# Patient Record
Sex: Female | Born: 1965 | Race: White | Hispanic: No | Marital: Single | Smoking: Never smoker
Health system: Southern US, Community
[De-identification: ages and names within clinical notes are randomized; demographics above are authoritative.]

## PROBLEM LIST (undated history)

## (undated) DIAGNOSIS — F32A Depression, unspecified: Secondary | ICD-10-CM

## (undated) DIAGNOSIS — R569 Unspecified convulsions: Secondary | ICD-10-CM

## (undated) DIAGNOSIS — F259 Schizoaffective disorder, unspecified: Secondary | ICD-10-CM

## (undated) DIAGNOSIS — H3320 Serous retinal detachment, unspecified eye: Secondary | ICD-10-CM

## (undated) DIAGNOSIS — H9190 Unspecified hearing loss, unspecified ear: Secondary | ICD-10-CM

## (undated) DIAGNOSIS — F419 Anxiety disorder, unspecified: Secondary | ICD-10-CM

## (undated) DIAGNOSIS — H269 Unspecified cataract: Secondary | ICD-10-CM

## (undated) DIAGNOSIS — M2450 Contracture, unspecified joint: Secondary | ICD-10-CM

## (undated) DIAGNOSIS — F329 Major depressive disorder, single episode, unspecified: Secondary | ICD-10-CM

## (undated) DIAGNOSIS — F250 Schizoaffective disorder, bipolar type: Secondary | ICD-10-CM

## (undated) DIAGNOSIS — N319 Neuromuscular dysfunction of bladder, unspecified: Secondary | ICD-10-CM

## (undated) DIAGNOSIS — IMO0001 Reserved for inherently not codable concepts without codable children: Secondary | ICD-10-CM

## (undated) DIAGNOSIS — G809 Cerebral palsy, unspecified: Secondary | ICD-10-CM

## (undated) DIAGNOSIS — I10 Essential (primary) hypertension: Secondary | ICD-10-CM

## (undated) DIAGNOSIS — K219 Gastro-esophageal reflux disease without esophagitis: Secondary | ICD-10-CM

## (undated) HISTORY — PX: ABDOMINAL HYSTERECTOMY: SHX81

## (undated) HISTORY — PX: LEG SURGERY: SHX1003

## (undated) HISTORY — PX: HAND SURGERY: SHX662

---

## 1997-12-10 ENCOUNTER — Inpatient Hospital Stay (HOSPITAL_COMMUNITY): Admission: EM | Admit: 1997-12-10 | Discharge: 1998-01-09 | Payer: Self-pay | Admitting: Emergency Medicine

## 1998-01-29 ENCOUNTER — Inpatient Hospital Stay (HOSPITAL_COMMUNITY): Admission: RE | Admit: 1998-01-29 | Discharge: 1998-03-03 | Payer: Self-pay | Admitting: *Deleted

## 1998-03-11 ENCOUNTER — Ambulatory Visit (HOSPITAL_COMMUNITY): Admission: RE | Admit: 1998-03-11 | Discharge: 1998-03-11 | Payer: Self-pay | Admitting: Urology

## 1998-03-11 ENCOUNTER — Encounter: Payer: Self-pay | Admitting: Urology

## 1998-03-13 ENCOUNTER — Encounter: Payer: Self-pay | Admitting: Urology

## 1998-03-13 ENCOUNTER — Ambulatory Visit (HOSPITAL_COMMUNITY): Admission: RE | Admit: 1998-03-13 | Discharge: 1998-03-13 | Payer: Self-pay | Admitting: Urology

## 1998-04-03 ENCOUNTER — Ambulatory Visit (HOSPITAL_COMMUNITY): Admission: RE | Admit: 1998-04-03 | Discharge: 1998-04-03 | Payer: Self-pay | Admitting: Urology

## 1998-04-03 ENCOUNTER — Encounter: Payer: Self-pay | Admitting: Urology

## 1998-06-21 ENCOUNTER — Encounter: Payer: Self-pay | Admitting: Emergency Medicine

## 1998-06-21 ENCOUNTER — Inpatient Hospital Stay (HOSPITAL_COMMUNITY): Admission: EM | Admit: 1998-06-21 | Discharge: 1998-06-23 | Payer: Self-pay | Admitting: Emergency Medicine

## 1998-06-24 ENCOUNTER — Inpatient Hospital Stay (HOSPITAL_COMMUNITY): Admission: EM | Admit: 1998-06-24 | Discharge: 1998-07-01 | Payer: Self-pay | Admitting: *Deleted

## 1998-07-30 ENCOUNTER — Inpatient Hospital Stay (HOSPITAL_COMMUNITY): Admission: AD | Admit: 1998-07-30 | Discharge: 1998-08-26 | Payer: Self-pay | Admitting: *Deleted

## 1998-08-21 ENCOUNTER — Encounter: Payer: Self-pay | Admitting: *Deleted

## 1998-08-21 ENCOUNTER — Encounter: Payer: Self-pay | Admitting: Urology

## 1998-08-26 ENCOUNTER — Encounter: Payer: Self-pay | Admitting: Cardiology

## 1998-08-26 ENCOUNTER — Inpatient Hospital Stay (HOSPITAL_COMMUNITY): Admission: AD | Admit: 1998-08-26 | Discharge: 1998-09-01 | Payer: Self-pay | Admitting: Cardiology

## 1998-08-27 ENCOUNTER — Encounter: Payer: Self-pay | Admitting: Cardiology

## 1998-08-29 ENCOUNTER — Encounter: Payer: Self-pay | Admitting: General Surgery

## 1998-09-22 ENCOUNTER — Inpatient Hospital Stay (HOSPITAL_COMMUNITY): Admission: AD | Admit: 1998-09-22 | Discharge: 1998-10-02 | Payer: Self-pay | Admitting: *Deleted

## 1998-10-17 ENCOUNTER — Inpatient Hospital Stay (HOSPITAL_COMMUNITY): Admission: EM | Admit: 1998-10-17 | Discharge: 1998-10-22 | Payer: Self-pay | Admitting: *Deleted

## 1998-11-10 ENCOUNTER — Encounter (HOSPITAL_COMMUNITY): Admission: RE | Admit: 1998-11-10 | Discharge: 1998-12-16 | Payer: Self-pay | Admitting: *Deleted

## 1998-12-23 ENCOUNTER — Encounter (HOSPITAL_COMMUNITY): Admission: RE | Admit: 1998-12-23 | Discharge: 1999-02-09 | Payer: Self-pay | Admitting: *Deleted

## 1999-03-31 ENCOUNTER — Inpatient Hospital Stay (HOSPITAL_COMMUNITY): Admission: AD | Admit: 1999-03-31 | Discharge: 1999-04-15 | Payer: Self-pay | Admitting: *Deleted

## 1999-05-14 ENCOUNTER — Other Ambulatory Visit: Admission: RE | Admit: 1999-05-14 | Discharge: 1999-05-21 | Payer: Self-pay

## 1999-08-18 ENCOUNTER — Inpatient Hospital Stay (HOSPITAL_COMMUNITY): Admission: EM | Admit: 1999-08-18 | Discharge: 1999-09-04 | Payer: Self-pay | Admitting: *Deleted

## 1999-09-07 ENCOUNTER — Other Ambulatory Visit (HOSPITAL_COMMUNITY): Admission: RE | Admit: 1999-09-07 | Discharge: 1999-09-16 | Payer: Self-pay

## 2001-12-22 ENCOUNTER — Encounter: Payer: Self-pay | Admitting: Internal Medicine

## 2001-12-22 ENCOUNTER — Ambulatory Visit (HOSPITAL_COMMUNITY): Admission: RE | Admit: 2001-12-22 | Discharge: 2001-12-22 | Payer: Self-pay | Admitting: Internal Medicine

## 2002-12-31 ENCOUNTER — Encounter: Payer: Self-pay | Admitting: Internal Medicine

## 2002-12-31 ENCOUNTER — Ambulatory Visit (HOSPITAL_COMMUNITY): Admission: RE | Admit: 2002-12-31 | Discharge: 2002-12-31 | Payer: Self-pay | Admitting: Internal Medicine

## 2003-01-11 ENCOUNTER — Ambulatory Visit (HOSPITAL_COMMUNITY): Admission: RE | Admit: 2003-01-11 | Discharge: 2003-01-11 | Payer: Self-pay | Admitting: Internal Medicine

## 2003-01-11 ENCOUNTER — Encounter: Payer: Self-pay | Admitting: Internal Medicine

## 2003-04-08 ENCOUNTER — Encounter: Admission: RE | Admit: 2003-04-08 | Discharge: 2003-04-08 | Payer: Self-pay | Admitting: Internal Medicine

## 2003-06-19 ENCOUNTER — Inpatient Hospital Stay (HOSPITAL_COMMUNITY): Admission: EM | Admit: 2003-06-19 | Discharge: 2003-07-01 | Payer: Self-pay | Admitting: Psychiatry

## 2003-07-16 ENCOUNTER — Encounter: Admission: RE | Admit: 2003-07-16 | Discharge: 2003-07-16 | Payer: Self-pay | Admitting: *Deleted

## 2003-09-03 ENCOUNTER — Inpatient Hospital Stay (HOSPITAL_COMMUNITY): Admission: EM | Admit: 2003-09-03 | Discharge: 2003-09-26 | Payer: Self-pay | Admitting: Psychiatry

## 2003-09-18 ENCOUNTER — Encounter: Payer: Self-pay | Admitting: *Deleted

## 2004-01-24 ENCOUNTER — Ambulatory Visit (HOSPITAL_COMMUNITY): Admission: RE | Admit: 2004-01-24 | Discharge: 2004-01-24 | Payer: Self-pay | Admitting: Gastroenterology

## 2005-10-04 ENCOUNTER — Ambulatory Visit (HOSPITAL_COMMUNITY): Admission: RE | Admit: 2005-10-04 | Discharge: 2005-10-04 | Payer: Self-pay | Admitting: Urology

## 2005-11-10 ENCOUNTER — Encounter: Payer: Self-pay | Admitting: Urology

## 2006-02-20 ENCOUNTER — Emergency Department (HOSPITAL_COMMUNITY): Admission: EM | Admit: 2006-02-20 | Discharge: 2006-02-20 | Payer: Self-pay | Admitting: Emergency Medicine

## 2006-05-31 ENCOUNTER — Emergency Department (HOSPITAL_COMMUNITY): Admission: EM | Admit: 2006-05-31 | Discharge: 2006-06-01 | Payer: Self-pay | Admitting: Emergency Medicine

## 2006-12-13 ENCOUNTER — Ambulatory Visit (HOSPITAL_COMMUNITY): Admission: RE | Admit: 2006-12-13 | Discharge: 2006-12-13 | Payer: Self-pay | Admitting: Gastroenterology

## 2006-12-20 ENCOUNTER — Inpatient Hospital Stay (HOSPITAL_COMMUNITY): Admission: RE | Admit: 2006-12-20 | Discharge: 2006-12-22 | Payer: Self-pay | Admitting: Urology

## 2007-01-05 ENCOUNTER — Ambulatory Visit (HOSPITAL_COMMUNITY): Admission: RE | Admit: 2007-01-05 | Discharge: 2007-01-05 | Payer: Self-pay | Admitting: Internal Medicine

## 2007-06-06 ENCOUNTER — Inpatient Hospital Stay (HOSPITAL_COMMUNITY): Admission: EM | Admit: 2007-06-06 | Discharge: 2007-06-16 | Payer: Self-pay | Admitting: Emergency Medicine

## 2010-05-14 ENCOUNTER — Emergency Department (HOSPITAL_COMMUNITY): Admission: EM | Admit: 2010-05-14 | Discharge: 2010-05-14 | Payer: Self-pay | Admitting: Emergency Medicine

## 2010-05-18 ENCOUNTER — Encounter: Admission: RE | Admit: 2010-05-18 | Discharge: 2010-05-18 | Payer: Self-pay | Admitting: Family Medicine

## 2010-07-08 ENCOUNTER — Emergency Department (HOSPITAL_COMMUNITY)
Admission: EM | Admit: 2010-07-08 | Discharge: 2010-07-08 | Payer: Self-pay | Source: Home / Self Care | Admitting: Emergency Medicine

## 2010-08-17 ENCOUNTER — Ambulatory Visit (HOSPITAL_COMMUNITY)
Admission: RE | Admit: 2010-08-17 | Discharge: 2010-08-17 | Disposition: A | Discharge status: Home / Self Care | Payer: Medicare Other | Source: Ambulatory Visit | Attending: Internal Medicine | Admitting: Internal Medicine

## 2010-08-17 ENCOUNTER — Ambulatory Visit (HOSPITAL_COMMUNITY): Admission: RE | Admit: 2010-08-17 | Payer: Medicare Other | Source: Ambulatory Visit

## 2010-08-17 DIAGNOSIS — Z79899 Other long term (current) drug therapy: Secondary | ICD-10-CM | POA: Insufficient documentation

## 2010-08-17 DIAGNOSIS — Z0389 Encounter for observation for other suspected diseases and conditions ruled out: Secondary | ICD-10-CM | POA: Insufficient documentation

## 2010-08-17 DIAGNOSIS — R9431 Abnormal electrocardiogram [ECG] [EKG]: Secondary | ICD-10-CM | POA: Insufficient documentation

## 2010-09-22 LAB — CBC
Platelets: 216 10*3/uL (ref 150–400)
RBC: 4.43 MIL/uL (ref 3.87–5.11)
RDW: 13.8 % (ref 11.5–15.5)
WBC: 8.1 10*3/uL (ref 4.0–10.5)

## 2010-09-22 LAB — URINALYSIS, ROUTINE W REFLEX MICROSCOPIC
Bilirubin Urine: NEGATIVE
Ketones, ur: NEGATIVE mg/dL
Specific Gravity, Urine: 1.03 (ref 1.005–1.030)
Urobilinogen, UA: 1 mg/dL (ref 0.0–1.0)
pH: 5 (ref 5.0–8.0)

## 2010-09-22 LAB — URINE CULTURE
Colony Count: >=100000
Culture  Setup Time: 201111040037

## 2010-09-22 LAB — URINE MICROSCOPIC-ADD ON

## 2010-09-22 LAB — COMPREHENSIVE METABOLIC PANEL
ALT: 13 U/L (ref 0–35)
AST: 14 U/L (ref 0–37)
Albumin: 3.4 g/dL — ABNORMAL LOW (ref 3.5–5.2)
Alkaline Phosphatase: 44 U/L (ref 39–117)
GFR calc Af Amer: >60 mL/min (ref >60)
Potassium: 4.3 mEq/L (ref 3.5–5.1)
Sodium: 139 mEq/L (ref 135–145)
Total Protein: 6.5 g/dL (ref 6.0–8.3)

## 2010-09-22 LAB — DIFFERENTIAL
Basophils Relative: 0 % (ref 0–1)
Eosinophils Absolute: 0.1 10*3/uL (ref 0.0–0.7)
Lymphs Abs: 2.5 10*3/uL (ref 0.7–4.0)
Monocytes Absolute: 0.6 10*3/uL (ref 0.1–1.0)
Monocytes Relative: 8 % (ref 3–12)
Neutro Abs: 4.8 10*3/uL (ref 1.7–7.7)

## 2010-10-01 ENCOUNTER — Emergency Department (HOSPITAL_COMMUNITY)
Admission: EM | Admit: 2010-10-01 | Discharge: 2010-10-01 | Disposition: A | Discharge status: Home / Self Care | Payer: Medicare Other | Attending: Emergency Medicine | Admitting: Emergency Medicine

## 2010-10-01 DIAGNOSIS — G809 Cerebral palsy, unspecified: Secondary | ICD-10-CM | POA: Insufficient documentation

## 2010-10-01 DIAGNOSIS — Y92410 Unspecified street and highway as the place of occurrence of the external cause: Secondary | ICD-10-CM | POA: Insufficient documentation

## 2010-10-01 DIAGNOSIS — F79 Unspecified intellectual disabilities: Secondary | ICD-10-CM | POA: Insufficient documentation

## 2010-10-01 DIAGNOSIS — T148XXA Other injury of unspecified body region, initial encounter: Secondary | ICD-10-CM | POA: Insufficient documentation

## 2010-10-01 DIAGNOSIS — M542 Cervicalgia: Secondary | ICD-10-CM | POA: Insufficient documentation

## 2010-11-24 NOTE — Op Note (Signed)
NAMEMADYLYN, INSCO               ACCOUNT NO.:  [id number]   MEDICAL RECORD NO.:  [id number]          PATIENT TYPE:  INP   LOCATION:  1316                         FACILITY:  [facility]   PHYSICIAN:  Petra Kuba, M.D.    DATE OF BIRTH:  08-25-1965   DATE OF PROCEDURE:  06/14/2007  DATE OF DISCHARGE:                               OPERATIVE REPORT   PROCEDURE:  Esophagogastroduodenoscopy, both regular and with the  pediatric colonoscope.   INDICATIONS:  Questionable gastric outlet obstruction.   Consent was signed after risks and benefits and options were thoroughly  discussed with the parents and the patient.   MEDICINES USED:  Fentanyl 100 micrograms, Versed 4 mg.   DESCRIPTION OF PROCEDURE:  First, the video endoscope was inserted via  direct vision.  The esophagus was normal, except for a tiny to small  hiatal hernia.  Scope passed into the stomach.  Very minimal fluid was  left, which was suctioned.  The scope was advanced through a normal  antrum, normal pylorus, into a normal duodenal bulb.  The second portion  of the duodenum did have a moderate amount of spasm.  No obvious  obstruction was seen.  However, due to looping in the stomach, could not  advance any further.  We went ahead and slowly withdrew back to the  bulb, which was normal, and back to the stomach, which was evaluated on  retroflexion.  Visualization without any additional findings.  Air was  suctioned.  Scope was slowly withdrawn again.  A good look at the  esophagus was normal.  The scope was removed.   We then went ahead and inserted the pediatric colonoscope, which was  easily able to advance through the duodenum and probably 10 cm down the  jejunum.  There were no signs of blockage, inflammation or other  abnormalities.  Again, some duodenal spasm was seen, but on slow  withdrawal, no additional findings were seen.  We had minimal difficulty  advancing this scope to that distance.  Once back in the  stomach, air  was suctioned, scope removed.  The patient tolerated the procedure well.  There was no obvious immediate complication.   ENDOSCOPIC DIAGNOSES:  1. Tiny hiatal hernia.  2. Widely patent pylorus.  3. Some duodenal spasm.  4. Unable to advance the scope past the second portion of duodenum.  5. Able to advance the pediatric colonoscope into the jejunum, which      was normal, without any obvious blockage, obstruction or      abnormality, except for spasm noted above.   PLAN:  We will re-try clear liquids, see how she does, possibly try an  antispasmodic.  Can hold NG tube for now.  We will follow with you.           ______________________________  Petra Kuba, M.D.     MEM/MEDQ  D:  06/14/2007  T:  06/14/2007  Job:  [id number]   cc:   Candyce Churn, M.D.  Fax: [phone]

## 2010-11-24 NOTE — Discharge Summary (Signed)
Kelly Dominguez, Kelly Dominguez               ACCOUNT NO.:  [id number]   MEDICAL RECORD NO.:  [id number]          PATIENT TYPE:  INP   LOCATION:  1316                         FACILITY:  [facility]   PHYSICIAN:  Candyce Churn, M.D.DATE OF BIRTH:  May 02, 1966   DATE OF ADMISSION:  06/06/2007  DATE OF DISCHARGE:  06/16/2007                               DISCHARGE SUMMARY   DISCHARGE DIAGNOSES:  1. Gastric and duodenal distention with functional outlet obstruction      likely secondary to bowel wall edema likely secondary to Trilafon.      Clinical syndrome masqueraded as SMA syndrome.  2. History of cerebral palsy.  3. History of psychosis with marked reduction in antipsychotic      medications during this admission with good clinical response.  4. History of scoliosis.  5. History of recurrent constipation.  6. History of recurrent urinary tract infection.  7. Gastroesophageal reflux disease.  8. Allergic rhinitis.  9. Seborrheic dermatitis.   DISCHARGE MEDICATIONS:  1. Effexor XR 150 mg p.o. b.i.d.  2. Lioresal 10 mg p.o. t.i.d.  3. Lortab 5/325 one p.o. b.i.d.  4. Cogentin 0.5 mg p.o. b.i.d. This perhaps can be discontinued.  5. Mylicon or equivalent substitute 80 mg p.o. b.i.d.  6. Dulcolax suppository 10 mg q.h.s.  7. Uroxatral 10 mg p.o. q.h.s.  8. Reglan 5 mg p.o. t.i.d. before meals.  9. Lotrimin cream 1% applied to facial rash b.i.d. p.r.n.  10.Protonix 40 mg daily.  11.Senokot S 2 tablets p.o. q.h.s.  12.Resource peach liquid 240 mL or equivalent t.i.d.  13.Tylenol 650 mg p.o. q.4 h p.r.n.   ALLERGIES:  TRILAFON appears to have caused significant functional  bowel, gastric outlet and duodenal obstruction likely secondary to  angioedema. Also does not tolerate CYMBALTA and SEPTRA.   CONSULTATIONS:  1. Gastroenterology, Dr. Dorena Cookey, et al.  2. General surgery, Dr. Claud Kelp.   HOSPITAL COURSE:  Kelly Dominguez is a very pleasant, 45 year old,  unfortunate female  with cerebral palsy who is confined to a skilled  nursing facility. She has a history of psychosis and cerebral palsy and  has chronic problems with constipation and has also had obstructive  renal calculi. She has had marked weight loss over the past year as well  as abdominal pain and intermittent distention with episodes of nausea  and vomiting. She underwent a two-view abdominal x-ray at [facility] the day of admission noting the possibility of gastric outlet  obstruction. She was sent to the emergency room where she had a slight  white count and urinalysis was positive for UTI. She was also noted to  be markedly hypokalemic at 2.3 consistent with her recurrent vomiting.  Two-view abdominal x-ray in the [facility] emergency room on June 06, 2007 revealed marked gastric distention and could not exclude  gastric outlet obstruction. She had an NG tube replaced with some  relief.   Oral medications were discontinued.   The patient's gastric outlet obstruction seemed to resolve after several  days with NG suction and off of oral medications. It was felt that she  might have an SMA syndrome because her duodenum seemed to taper and  obstruct at the spine where the SMA and aorta cross over the third  portion of the duodenum.   CT of the abdomen with contrast on June 06, 2007 revealed marked  gastric distention with additional dilatation of the first and second  portion of the duodenum and the duodenum transitioned to a very narrow  caliber as it cross the midline with a question of a nutcracker  phenomenon or possible SMA syndrome.   Gastroenterology was consulted, Dr. Dorena Cookey was so kind to see the  patient on June 07, 2007 and a barium study was recommended which  revealed minimal patches of contrast into the distal duodenum but a  dilated distending duodenum with mild full thickening of the duodenum.  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   With further intravenous fluids and NG suctioning, symptoms resolved and  the patient was able to be fed and she felt much better with resolution  of leukocytosis by June 11, 2007. In fact, white count dropped from  19,000 to 7,000 on June 11, 2007. The patient felt great and  actually had 2 bowel movements.   However, that evening after resuming oral medications, her nausea and  vomiting returned.   It seemed that the only therapeutic changes were that oral medications  were resumed.   On review of medications that can cause GI symptoms, it was noted that  Trilafon is associated with angioneurotic edema as well as nausea and  vomiting.   The patient had to have NG tube reinserted and 3 liters of gastric  content and liquid were suctioned.   On the last 48 hours of admission, she had an EGD on June 14, 2007  which was within normal limits and this was after Trilafon had been  discontinued.   Resuming oral medications without Trilafon, the patient has done very  well.   The patient will be discharged back to [facility] nursing home eating  well on the above medications with vital signs as follows:  Temperature  98.8, pulse 79 and regular, respiratory rate 18 and nondistressed, blood  pressure 105/73, room air O2 saturation 96%.   I should mention that she did have a urinary tract infection while  hospitalized growing greater than 100,000 colonies of Providencia  stuartii. It was resistant to Cipro which she had originally been put on  though she seemed to clear the urinary tract regardless and she received  2 more doses of Rocephin prior to discharge and final urine culture on  June 11, 2007 revealed no growth.   DISCHARGE LABORATORIES:  CBC from June 16, 2007 revealed a white  count of 8200, hemoglobin 11.4,  platelet count 385,000. BMET revealed a  sodium of 138, potassium 4.8, chloride 106, bicarb 27, glucose 85, BUN  12, creatinine 0.45 and prealbumin was actually normal on June 15, 2007 at 19.7. Urinalysis on June 15, 2007 was slightly cloudy, pH  6.0, small blood, nitrite negative, leukocytes negative. Triglycerides  on June 15, 2007 were 102, phosphorus was 4.1, magnesium was 2.2.  LFTs on June 13, 2007 revealed normal LFTs except for a slightly low  albumin of 2.8. Alkaline phosphatase 66, AST 13, ALT 11.   CONDITION ON DISCHARGE:  Much improved. The patient will be discharged  back  to [facility] nursing home.      Candyce Churn, M.D.  Electronically Signed     RNG/MEDQ  D:  06/16/2007  T:  06/16/2007  Job:  [id number]   cc:   Angelia Mould. Derrell Lolling, M.D.  [address], [address]  [place]  [place] [address]   Ever[facility] All. Madilyn Fireman, M.D.  Fax: [phone]

## 2010-11-24 NOTE — H&P (Signed)
Kelly Dominguez, Kelly Dominguez               ACCOUNT NO.:  [id number]   MEDICAL RECORD NO.:  [id number]          PATIENT TYPE:  INP   LOCATION:  0104                         [facility]ACILITY:  [facility]   PHYSICIAN:  Hollice Espy, M.D.DATE OF BIRTH:  Jul 20, 1965   DATE OF ADMISSION:  06/06/2007  DATE OF DISCHARGE:                              HISTORY & PHYSICAL   PRIMARY CARE PHYSICIAN:  Candyce Churn, M.D.   CHIEF COMPLAINT:  Abdominal pain.   HISTORY OF PRESENT ILLNESS:  The patient is a 45 year old white female  with past medical history of psychosis and cerebral palsy who also  suffers from problems of chronic constipation as well as obstructing  kidney stones who has been noticing worsening abdominal pain over the  last 1-2 days.  She is cared for at a group home.  She continued to have  abdominal pain as well as distention and started having episodes of  nausea and vomiting.  She underwent a 2-view abdominal x-ray at  [facility] that noted possibility of gastric outlet  obstruction.  She was sent over to the emergency room, where on arrival,  she had labs drawn.  She was noted to have a white count of 11.1 with  shift and UA positive for UTI.  She also was noted to have a potassium  of 2.3 consistent with her recurrent vomiting.  A 2-view abdominal x-ray  showed marked gastric distention and cannot exclude a gastric outlet  obstruction, both bibasilar atelectasis.  Nonobstructing  left renal  calculi was noted as well.  The patient had an NG tube placed to suction  which provided some relief.   REVIEW OF SYSTEMS:  Currently she is doing okay.  She says her pain and  nausea are better but they are still present.  She denies any headaches  or vision changes.  No dysphagia, no chest pain or palpitations, no  shortness of breath, no wheezing or coughing.  She does admit to some  abdominal pain and distention.  No hematuria or dysuria.  She has had  problems with constipation.   No focal extremity numbness, weakness, or  pain.  Review of systems otherwise negative.   PAST MEDICAL HISTORY:  1. Psychosis.  2. Recurrent depressive disorder.  3. GERD.  4. Cerebral palsy.   MEDICATIONS:  1. Lexapro 20.  2. Prilosec 20.  3. Geodon 1 mg at breakfast.  4. Senokot daily.  5. Geodon 160 mg in the evening.  6. Cogentin 0.5 b.i.d.  7. Effexor 150 b.i.d.  8. Percocet 7.5/650 b.i.d.  9. Flonase 50 mcg one spray each nostril b.i.d.  10.Simethicone two tabs b.i.d.  11.Baclofen 10 t.i.d.  12.Resource.  13.Trilafon 4 mg at bedtime.  14.Dulcolax suppository at bedtime.  15.Uroxatral 10 at bedtime.  16.Senokot three tabs at bedtime.  17.Carafate four times a day.  18.Ativan one t.i.d.  19.Trilafon 2 mg in the morning.  20.P.r.n. Ativan, Lorcet, Claritin, and Baclofen.   ALLERGIES:  1. SEPTRA.  2. CYMBALTA.   SOCIAL HISTORY:  She resides in a group home.  No current tobacco,  alcohol, or drug  use.   FAMILY HISTORY:  Noncontributory.   PHYSICAL EXAMINATION:  VITAL SIGNS:  On admission, temperature 97.9,  heart rate 91, blood pressure 128/92, respirations 20, O2 saturation 97%  on 2 L.  GENERAL:  In general she is alert and oriented x2.  HEENT:  Normocephalic, atraumatic.  Mucous membranes are slightly dry.  She had an NG tube placed.  NECK:  She has no carotid bruits.  HEART:  Regular rate and rhythm.  S1/S2.  LUNGS:  Clear to auscultation bilaterally.  ABDOMEN:  Soft, distended.  Some tenderness, diffuse, with absent bowel  sounds.  EXTREMITIES:  Show no clubbing, cyanosis, or edema.   LABORATORY DATA:  White count 11.1, H&H 13.8 and 40, MCV 85, platelet  count 342, 81% shift.  Sodium 137, potassium 2.3, chloride 97, bicarb  26, BUN 12, creatinine 0.42, glucose 136.  LFTs are unremarkable.  Lipase normal.  UA notes 100 of protein, trace ketones, large leukocyte  esterase with 21-50 white cells, many bacteria.   ASSESSMENT/PLAN:  1. Gastric outlet  obstruction, questionably possible to constipation      playing a role.  Will n.p.o.  IV Protonix.  Check a CT of the      abdomen and pelvis.  Maybe start on a more aggressive bowel      regimen.  2. Psychosis.  Holding all p.o. medications unfortunately for now.      Maybe restart p.o.'s as soon as possible when she is able to take      p.o.  3. Urinary tract infection.  Again will check CT of the abdomen and      pelvis to rule out any type of obstructing stone.  Treat with IV      Cipro.  4. Cerebral palsy, stable.      Hollice Espy, M.D.  Electronically Signed     SKK/MEDQ  D:  06/06/2007  T:  06/07/2007  Job:  [id number]   cc:   Candyce Churn, M.D.  Fax: [phone]

## 2010-11-24 NOTE — Consult Note (Signed)
Kelly Dominguez, Kelly Dominguez               ACCOUNT NO.:  [id number]   MEDICAL RECORD NO.:  [id number]          PATIENT TYPE:  INP   LOCATION:  1316                         FACILITY:  [facility]   PHYSICIAN:  Angelia Mould. Derrell Lolling, M.D.DATE OF BIRTH:  [birth year]   DATE OF CONSULTATION:  06/09/2007  DATE OF DISCHARGE:                                 CONSULTATION   REFERRING PHYSICIAN:  Candyce Churn, M.D.   REASON FOR CONSULTATION:  Evaluate of gastric distention and possible  superior mesenteric artery syndrome.   HISTORY OF PRESENT ILLNESS:  This is a 45 year old white female who was  admitted to this hospital on June 06, 2007.  At that time she had  developed abdominal distention, abdominal pain, nausea and vomiting.   Her past history is significant for cerebral palsy, psychosis,  scoliosis, and a long history of recurrent constipation and urinary  tract infection.  In the past year she has had vague GI symptoms and  according to the family has lost about 25 pounds, although she is still  somewhat overweight.   Following her admission this time, she was found to have an abdominal  film showing acute gastric distention and a potassium of 2.3.  She has  had a CT scan which shows acute gastric distention and narrowing of the  third portion of the duodenum as it goes past the superior mesenteric  artery.  There is no inflammatory change.  There is no mass and there is  no adenopathy.  She has had an upper GI which shows partial obstruction  at the third portion of the duodenum, although this looks more like an  effacement of the duodenum than anything else.  When she was turned and  x-rays were taken later in the day, all of the contrast had gone through  and was in the colon.  This morning all of the contrast was in the colon  and the gastric distention has resolved.   She now states that she is hungry, has no pain, is passing flatus but no  stools today.  She has been seen by Dr. Dorena Cookey.   PAST MEDICAL HISTORY:  1. Psychosis.  2. Scoliosis.  3. Gastroesophageal reflux disease.  4. Cerebral palsy.  5. Status post abdominal hysterectomy.  6. Chronic urinary tract infections.  7. Chronic recurring constipation.   CURRENT MEDICATIONS:  1. Lexapro 20 mg a day.  2. Prilosec 20 mg a day.  3. Geodon 1 mg at breakfast.  4. Senokot daily.  5. Geodon 160 mg the evening.  6. Cogentin 0.5 mg b.i.d.  7. Effexor 150 mg b.i.d.  8. Percocet.  9. Flonase nasal spray.  10.Simethicone 2 tablets b.i.d.  11.Baclofen 10 mg t.i.d.  12.Resource.  13.Trilafon 4 mg at bedtime.  14.Dulcolax suppository.  15.Uroxatral 10 mg at bedtime.  16.Carafate.  17.Ativan.   ALLERGIES:  Septra and Cymbalta.   SOCIAL HISTORY:  She resides in a group home.  No current tobacco,  alcohol or drug use.  Her mother and father are here today in the  hospital visiting her.   FAMILY HISTORY:  Noncontributory.   REVIEW OF SYSTEMS:  Noncontributory.   PHYSICAL EXAM:  Pleasant, talkative, short-statured, somewhat overweight  white female in no distress.  She does have an obvious scoliosis.  She  is in the bed.  Temperature 98.5, heart rate 88 and regular, respirations 18, blood  pressure 124/63.  HEENT:  Eyes:  Sclerae clear.  Extraocular movements are intact.  Ears,  mouth and throat, nose, lips, tongue and oropharynx without gross  lesions.  NECK:  No mass.  No jugular distention.  LUNGS:  Clear to auscultation.  No chest wall tenderness.  HEART:  Regular rate and rhythm.  No murmur.  Radial and femoral pulses  are palpable.  ABDOMEN:  Soft, active bowel sounds, borderline distended but not  tympanitic.  Not tender.  Lower midline scar.  No masses.  No hernias.  Essentially a benign exam.  EXTREMITIES:  No clubbing, cyanosis or edema.  There is muscle wasting  and contractures.   DATA REVIEWED:  I reviewed the CT scan and the upper GI as described  above.  Hemoglobin 14.6, white blood  cell count 20,300, potassium today  is 3.1, glucose 108.   ASSESSMENT:  1. 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  Diagnosis is not      secure.  The superior mesenteric artery syndrome seems less likely      considering her normal body habitus, but perhaps scoliosis is      playing some role.  We need to rule out other causes of narrow      duodenum seen on CT and we need to rule out motility disorder.  2. Acute gastric distention, resolved, presumably secondary to #1      above.  3. Hypokalemia.  This needs to be corrected to a potassium of 4.0 to      promote GI motility.  4. Cerebral palsy.  5. Psychosis.  6. Scoliosis.  7. Chronic constipation.  8. Recurrent urinary tract infections.   PLAN:  1. There is no indication for surgical intervention at this point.  2. I agree with Dr. Madilyn Fireman that since she is obviously getting better,      attempt at feeding of liquids should be      considered.  3. If she has any recurring symptoms she will need an endoscopy and      examination of the entire duodenum to make sure there is not some      other etiology.      Angelia Mould. Derrell Lolling, M.D.  Electronically Signed     HMI/MEDQ  D:  06/09/2007  T:  06/09/2007  Job:  [id number]   cc:   Candyce Churn, M.D.  Fax: [phone]   Everardo All. Madilyn Fireman, M.D.  Fax: [phone]   Hollice Espy, M.D.

## 2010-11-24 NOTE — Op Note (Signed)
NAMECHASADY, LONGWELL               ACCOUNT NO.:  [id number]   MEDICAL RECORD NO.:  [id number]          PATIENT TYPE:  OIB   LOCATION:  1426                         FACILITY:  [facility]   PHYSICIAN:  Jamison Neighbor, M.D.  DATE OF BIRTH:  August 18, 1965   DATE OF PROCEDURE:  12/19/2006  DATE OF DISCHARGE:                               OPERATIVE REPORT   PREOPERATIVE DIAGNOSES:  1. Cerebral palsy, with neurogenic bladder.  2. Chronic cystitis.  3. Right ureteral calculus.   POSTOPERATIVE DIAGNOSES:  1. Cerebral palsy, with neurogenic bladder.  2. Chronic cystitis.  3. Right ureteral calculus.   PROCEDURE:  Cystoscopy, right retrograde, right ureteroscopy, and right  in situ laser lithotripsy with right double-J catheter insertion.   SURGEON:  Jamison Neighbor, M.D.   ANESTHESIA:  General.   COMPLICATIONS:  None.   DRAINS:  A 6-French x 24 double-J catheter on a string.   HISTORY:  This 45 year old female with known cerebral palsy has had  problems with chronic cystitis.  The patient has been a longstanding  patient of Dr. Vic Blackbird.  She had a KUB that did not show any  clear-cut stones, but it was noted at that time that the image was  somewhat obscured by bowel gas.  The patient presented later on with  pain in the right-hand side and hematuria.  In Dr. Valda Lamb absence  while he was traveling, I was happy to see her and obtain a CT scan.  The CT scan showed a stone in the midureter with significant  obstruction, and for that reason we made arrangements to bring the  patient into the hospital for surgical intervention. The patient and her  parents were both apprised of he risks and benefits of the procedure.  Full informed consent was obtained.   PROCEDURE:  After successful induction of general anesthesia, the  patient was placed in a modified version of the lithotomy position.  Candy-cane stirrups were utilized, and the patient's legs were hung  somewhat superiorly  because her legs really did not bend due to  contractures.  The patient was then prepped in the usual sterile  fashion.  The cystoscope was inserted.  The bladder was carefully  inspected.  There was some inflammatory tissue around the trigone, but  otherwise the bladder was relatively unremarkable.  The right ureteral  orifice was identified.  A retrograde study was performed on that side.  Contrast was seen to ascend up the urine, and then there did appear to  be some obstruction, and it was difficult to pass a ureteral catheter  beyond that point.  A standard guidewire would not pass beyond the level  of the obstruction, but a sensor wire with a soft tip was advanced up  into the kidney.  The ureteral catheter was then advanced up into the  kidney, which was opacified with contrast.  The retrograde study showed  some degree of hydronephrosis.  There was no filling defects within the  collecting system.  There did appear to be a small caliceal diverticulum  coming off the upper pole.  No stones  could be identified within that  kidney.  The guidewire was left in place, and the distal ureter was  dilated with a ureteral access sheath.  The ureteroscope was then  inserted.  It was somewhat difficult to advance this due to the  patient's body habitus, but eventually the stone could be visualized.  The stone was quite impacted in the ureteral mucosa, and it did appear  to have a somewhat soft appearance, felt to be consistent with struvite.  The laser was then utilized to break up the stone as much as possible  into small pieces that were flushed and extracted down into the bladder.  The ureteroscope could be advanced beyond that point, and there was no  more proximal obstruction.  Due to body habitus, the ureteroscope could  not be advanced all way to kidney, but on the retrograde studies there  appeared to be no more proximal obstruction.  The ureteroscope was  withdrawn.  The ureter had  not been injured, with the exception of the  area where the stone had grown under the mucosa.  Because of that area,  however, it was felt that a stent would need to be left in place.  The  stent was then passed over the guidewire and found to coil normally in  the upper pole and also coil normally within the bladder.  The bladder  was drained.  The string was left exiting the urethra and will be taped  to the leg, and eventually in a week or so the stent will be removed.  Because this appears to be a struvite stone and the patient is nursing  home dependent, it is felt it would be best to keep her for 23-hour  observation, including antibiotic coverage.           ______________________________  Jamison Neighbor, M.D.  Electronically Signed     RJE/MEDQ  D:  12/19/2006  T:  12/20/2006  Job:  [id number]

## 2010-11-24 NOTE — Discharge Summary (Signed)
Kelly Dominguez, Kelly Dominguez               ACCOUNT NO.:  [id number]   MEDICAL RECORD NO.:  [id number]          PATIENT TYPE:  INP   LOCATION:  1426                         FACILITY:  [facility]   PHYSICIAN:  Jamison Neighbor, M.D.  DATE OF BIRTH:  Apr 03, 1966   DATE OF ADMISSION:  12/19/2006  DATE OF DISCHARGE:  12/21/2006                               DISCHARGE SUMMARY   STAT DISCHARGE SUMMARY   DISCHARGE DIAGNOSES:  1. Cerebral palsy.  2. Chronic cystitis.  3. Ureteral calculus.   HISTORY:  This 45 year old female is a longstanding patient of Dr.  Aldean Ast, who is known to have cerebral palsy and chronic cystitis.  The patient had a recent KUB by Dr. Aldean Ast, which did not show a  stone but began to have problems with right-sided pain and hematuria.  A  CT scan that I obtained showed a stone in the ureter with obstruction.  The patient was brought to the hospital for cystoscopy and retrograde  studies.   The patient was taken to the operating room where she underwent  cystoscopy, a right ureteroscopy, and a right in situ laser lithotripsy  with double-J catheter insertion.  It was planned that the patient would  have 23 hours observation, but on postoperative day #1 she did have some  problems with fever and malaise and for that reason she was kept an  additional 24 hours.   PAST MEDICAL HISTORY:  1. 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  2. Chronic cystitis.   CURRENT MEDICATIONS:  1. Uroxatral 10 mg p.o. q.h.s.  2. Toradol 10 mg p.o. t.i.d. p.r.n. pain.  3. Baclofen 5 mg t.i.d. p.r.n. muscle spasms.  4. Ativan 0.5 mg every 8 hours as needed for anxiety.  5. Lisinopril 30 mg daily.  6. Senokot-S-S three tablets at night.  7. Dulcolax suppositories one at night.  8. MiraLax given every morning.  9. Trilafon 2 mg tablets two at night.  10.Effexor-XR 75 mg at night.  11.Trilafon 2 mg tablets one in the morning.  12.Effexor-XR 150 mg in the morning.  13.Prilosec  20 mg daily.  14.Geodon 40 mg b.i.d.  15.Flonase nasal spray b.i.d.  16.Simethicone 80 mg tablets two b.i.d.  17.Ultram 50 mg tablets two b.i.d.  18.Tranxene 3.75 mg tablets one t.i.d.  19.Suprefact one q.i.d.   The patient is ALLERGIC TO:  1. CYMBALTA.  2. SEPTRA.   FAMILY HISTORY, SOCIAL HISTORY, REVIEW OF SYSTEMS:  Noncontributory.   As noted above, the patient was taken to the operating room where she  underwent successful in situ laser lithotripsy.  This had the appearance  of a Struvite stone and did break up pretty well, but I left the stent  in place because she is going to have to pass that stone material.  I  did leave the double-J on a string so that it could eventually be  removed.   The patient did feel better by postoperative day #2 and was ready for  discharge.  I want her on Levaquin for about two weeks because I do  think this is a Struvite stone and  it will release bacteria.  She needs  to be on Lorcet-Plus to take as needed for pain.  She will return to see  Dr. Aldean Ast or me in two weeks, and that appointment can be obtained  by calling [phone].  All other medications will remain unchanged as  listed above.           ______________________________  Jamison Neighbor, M.D.  Electronically Signed     RJE/MEDQ  D:  12/21/2006  T:  12/21/2006  Job:  [id number]

## 2010-11-24 NOTE — Discharge Summary (Signed)
NAMEANGELISSE, Kelly Dominguez               ACCOUNT NO.:  [id number]   MEDICAL RECORD NO.:  [id number]          PATIENT TYPE:  INP   LOCATION:  1316                         FACILITY:  [facility]   PHYSICIAN:  Candyce Churn, M.D.DATE OF BIRTH:  07-Nov-1965   DATE OF ADMISSION:  06/06/2007  DATE OF DISCHARGE:  06/12/2007                               DISCHARGE SUMMARY   DISCHARGE DIAGNOSES:  1. 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  2. Probable spontaneous resolution of the superior mesenteric artery      syndrome on June 08, 2007.  3. A possible upper gastrointestinal overgrowth of yeast, with marked      reduction of leukocytosis, with the onset of use of Diflucan for      severe oral thrush on June 10, 2007.  4. History of obstructive renal calculi.  5. History of cerebral palsy.  6. History of psychosis.  7. Chronic constipation.  8. Muscle spasm secondary to cerebral palsy.  9. Urinary tract infection with Providencia stuartii greater than      100,000 colonies, relatively resistant to Cipro and will be treated      with Rocephin 1 gram IV after 5 days of therapy with Ciprofloxacin.   DISCHARGE MEDICATIONS:  1. Uroxatral 10 mg p.o. q.h.s.  2. Baclofen 10 mg p.o. t.i.d.  3. Cogentin 0.5 mg p.o. b.i.d.  4. Dulcolax suppository 10 mg rectally p.r.n.  5. Rocephin 5 mg IV daily for 3 days.  6. Diflucan 20 mg daily for 7 days.  7. Norco 1 tab p.o. b.i.d.  8. Ativan 1 mg p.o. t.i.d.  9. Protonix 40 mg daily.  10.Trilafon 4 mg p.o. q.h.s. and 2 mg p.o. q.a.m.  11.Senokot-S, three tabs p.o. daily.  12.Simethicone 80 mg p.o. b.i.d.  13.Effexor 150 mg p.o. b.i.d.  14.Geodon 150 mg p.o. q.h.s. and 20 mg p.o. q.a.m.   CONSULTATIONS:  1. Gastroenterology, June 07, 2007 - Dr. Dorena Cookey.  2. Surgery - Dr. Claud Kelp - June 09, 2007.   DISCHARGE LABORATORIES:  1. Urinalysis June 11, 2007, within normal limits.  2. Urinalysis June 06, 2007, was  turbid with specific gravity      1.026 with large leukocytes and microscopic revealing many bacteria      and 21 to 50 white cells.  3. Urine culture from June 06, 2007 grew out greater than 100,000      colonies of Providencia stuartii, intermediately sensitive to      Levaquin and resistant to Cipro but sensitive to Ceftriaxone.  4. BMET from June 11, 2007 revealed a sodium of 134, potassium      3.7, chloride 108, bicarb 22, glucose 95, BUN 3, creatinine 0.43,      calcium 8.3.  5. CBC from June 11, 2007 revealed a white count of 7,200,      hemoglobin of 11.4, platelet count 288,000.  6. CBC from June 10, 2007 revealed a white count markedly elevated      at 19,300, hemoglobin 14.2; platelet count 322,000, Seemed to drop      precipitously after Diflucan was started.  7. Lipase  June 06, 2007 was normal at 31.  8. LFTs from June 06, 2007 were within normal limits.  9. CBC on admission revealed a white count of 11,100, platelet count      342,000, hemoglobin 13.8.   Radiology images included the following:  1. Abdominal x-ray June 06, 2007 revealed significant gastric      distention, can not exclude gastric outlet obstruction.      Osteoporosis with bi-convex thoracolumbar scoliosis noted.  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  2. CT of the abdomen and pelvis June 07, 2007 revealed bibasilar      atelectasis, but greater in the right lower lobe.  Multiple non-      obstructive left renal calculi:  Largest was 10 cm in size.  Marked      gastric distention with additional dilatation of the first and      second portions of the duodenum.  Is a very narrow caliber as it      crossed the midline, with a question of nutcracker phenomenon      between the aorto spine and the superior mesenteric vessels.      Increased stool in colon.  CT of the pelvis revealed a right      ovarian cyst 3.7 x 2.7 cm.  No additional pelvic masses,      adenopathy, free fluid or  inflammatory process.  Bilateral hip      degenerative changes.  3. A two-view of the abdomen from June 08, 2007 revealed passage      of oral contrast past the duodenum with no evidence of small bowel      obstruction.  4. Upper GI with KUB on June 07, 2007 revealed rapid tapering of      the duodenum across the spine, suggesting superior mesenteric      artery syndrome.  5. June 09, 2007 - Abdominal  x-ray.  Improvement of gastric      distention, with passage of oral contrast into the non-dilated      colon.   HOSPITAL COURSE:  Kelly Dominguez is a pleasant but unfortunate 45-  year-old female who is placed at [facility].  She is  essentially bedridden with severe cerebral palsy.  Very pleasant.   On June 06, 2007, the patient developed severe abdominal  distention, abdominal  pain, nausea, vomiting.  She has had some vague  GI symptoms over the past year and has lost about 25 pounds.  Abdominal  x-ray on admission revealed acute gastric distention and a potassium of  2.3.  A CT scan had suggested superior mesenteric artery syndrome.   With the NG tube placement and bowel rest and replacement of potassium  and treatment of Providencia stuartii urinary tract infection, she began  to improve but had marked improvement with Diflucan therapy given on  June 10, 2007.  She was noted to have rather severe oral thrush and  complains of some dysphagias in retrospect.   She was seen in consultation by Dr. Dorena Cookey of [facility]  Gastroenter[facility], and Dr. Claud Kelp of [facility],  and, fortunately, simply with bowel rest - though the patient had  problems maintaining NG in properly because of inadvertently pulling the  tube out twice, but nonetheless the patient improved with IV fluids, NPO  status initially, and time.  She had a leukocytosis in the 20,000 range  for several days, but this resolved rather abruptly from November 29 to  November 30, with institution of Diflucan therapy for severe oral  thrush.   It is not known whether the leukocytosis was caused by yeast, which  certainly could have over-colonized the GI tract with frequent use of  antibiotics for recurrent urinary tract infections.  Regardless, she  improved, a great deal was able to eat, clear liquids and a low-residue  diet prior to discharge with the abdomen becoming non-distended and a  question of SMA syndrome resolving.   The patient will be discharged back to [facility] in  improved condition on a low-residue diet for one week, then back to a  normal diet.  We will treat the Cipro-resistant Providencia stuartii UTI  with 3 days of IV Rocephin, then patient can discontinue IV on the third  day back at [facility].  A repeat urinalysis at discharge was  within normal limits, and patient was treated with 4 to 5 days of Cipro  prior to discharge, but Providencia stuartii is considered to be  resistant to that, though borderline.  Will discharge on above  medications, in addition to Rocephin 500 mg IV q.day x3.   Will continue Diflucan for a total of a week, just in case she had  bacterial colonization of overgrowth of yeast.   CONDITION ON DISCHARGE:  Much improved.   The patient can be discharged on December 1st, back to [facility].      Candyce Churn, M.D.  Electronically Signed     RNG/MEDQ  D:  06/11/2007  T:  06/11/2007  Job:  [id number]   cc:   Room 1316

## 2010-11-24 NOTE — Consult Note (Signed)
Kelly Dominguez, Kelly Dominguez               ACCOUNT NO.:  [id number]   MEDICAL RECORD NO.:  [id number]          PATIENT TYPE:  INP   LOCATION:  1316                         FACILITY:  [facility]   PHYSICIAN:  John C. Madilyn Fireman, M.D.    DATE OF BIRTH:  01-14-1966   DATE OF CONSULTATION:  06/07/2007  DATE OF DISCHARGE:                                 CONSULTATION   REASON FOR CONSULTATION:  Gastric outlet obstruction.   HISTORY OF PRESENT ILLNESS:  The patient is a 45 year old white female  with history of cerebral palsy and psychosis who was admitted with  abdominal distention, nausea and vomiting.  Her caretaker at her group  home states that she had a good bowel movement on Monday after some milk  of magnesia and noticed that her abdominal contour was relatively flat  at that time.  Later that evening, she began having nausea and vomiting.  By morning of November 25, her abdomen was noted to be fairly grossly  distended.  She was given enemas with no results and give some lactulose  and milk of magnesia and began vomiting last night.  A KUB showed  significant gastric distention, and she was sent to emergency room and  admitted.  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  She has NG tube now which has  drained a significant amount of fluid although her abdomen is still  distended.  She denies any definite abdominal pain.   PAST MEDICAL HISTORY:  1. Psychosis.  2. Cerebral palsy.  3. Gastroesophageal reflux.  4. Chronic constipation.  5. Recurrent depressive disorder.   MEDICATIONS:  Lexapro, Prilosec, Geodon, Cogentin, Effexor, Percocet,  Flonase, simethicone, Carafate, Senokot.   PHYSICAL EXAMINATION:  Alert and oriented in no acute distress.  There  is obvious spinal scoliosis.  HEART:  Regular  rate and rhythm without murmur.  ABDOMEN:  Moderately distended, semi-taut with hypoactive bowel sounds.  No hepatosplenomegaly, mass or guarding.  There is diffuse mild  abdominal tenderness.   CT scan was reviewed with the radiologist on the above findings were  confirmed.   IMPRESSION:  Duodenal obstruction probably related to extrinsic  compression from SMA, aorta and/or spine.  This was not evident on a CT  scan of June of this year.   PLAN:  Discuss with radiology and decided to the obtain a barium study  through the NG tube for better characterization of the nature of the  obstruction which is presumably extrinsic.  Will continue NG suction and  will probably need surgery's input at some point.           ______________________________  Everardo All. Madilyn Fireman, M.D.     JCH/MEDQ  D:  06/07/2007  T:  06/07/2007  Job:  [id number]

## 2010-11-27 NOTE — H&P (Signed)
Kelly Dominguez, Kelly Dominguez                           ACCOUNT NO.:  [id number]   MEDICAL RECORD NO.:  [id number]                   PATIENT TYPE:  IPS   LOCATION:  0300                                 FACILITY:  [facility]   PHYSICIAN:  Jeanice Lim, M.D.              DATE OF BIRTH:  09/02/1965   DATE OF ADMISSION:  09/03/2003  DATE OF DISCHARGE:                         PSYCHIATRIC ADMISSION ASSESSMENT   IDENTIFYING INFORMATION:  This is a 45 year old white female who is single.  This is a voluntary admission.  The patient's chief complaint:  They are  getting me agitated and trying to make me do things.   HISTORY OF PRESENT ILLNESS:  This is the second [facility] admission for this 45 year old white female who has cerebral palsy  and has been living at [facility] facility.  Over the past 1-  2 weeks she had become more and more agitated and irritable.  She had been  feeling anxious and panicky, had been refusing to get out of bed, had been  shouting at the staff, swinging her arms and trying to strike them.  She  also reports having abdominal discomfort over the past week and says that  nobody understands.  She attributes not wanting to get out of bed to  having belly pain.  According to rest home staff, she had been agitated and  hollering a lot, generally displaying significantly more irritability,  crying more frequently and uncooperative with care.  The patient had  expressed to the rest home staff that she had had thoughts of wanting to end  her life, that she was worth nothing and wanted to turn her wheelchair over  as a method of harming herself.   PAST PSYCHIATRIC HISTORY:  This is the patient's second admission to [facility].  She has been diagnosed with schizoaffective  disorder and has been followed by Dr. Mariana Single in the [facility] outpatient  clinic.   SOCIAL HISTORY:  This is a single white female who is  handicapped from birth  with cerebral palsy, currently living at [facility]  facility.  She does have supportive parents here in town who are involved in  her care.  Never married, no children, no legal charges.   FAMILY HISTORY:  Noncontributory.   ALCOHOL AND DRUG HISTORY:  No evidence of substance abuse.   PAST MEDICAL HISTORY:  The patient is followed by Dr. Aldean Ast, M.D. and by  Dr. Rodrigo Ran who are her primary care physicians.  Medical problems  include cerebral palsy, multiple chronic urinary tract infections.  Past  medical history is remarkable for a total abdominal hysterectomy, tendon and  muscle surgeries related to her cerebral palsy.  No past history of seizure.   MEDICATIONS:  Cymbalta 60 mg which was increased from 30 mg on August 15, 2003, MiraLax 17 gm in water daily,  p.r.n., Colace 100 mg p.o. b.i.d., fiber  laxative 625 mg 1 p.o. t.i.d., Symmetrel 100 mg p.o. b.i.d., Protonix 40 mg  p.o. b.i.d. with meals, Desonide cream 0.5% to apply to affected areas of  her skin that are reddened b.i.d., Baclofen 10 mg 2 tabs 3 times daily for  discomfort, Trileptal 150 mg p.o. t.i.d. as a mood stabilizer, Seroquel 100  mg p.o. q.h.s., Claritin 10 mg p.o. q.a.m. As an alternate laxative, the  patient has also used in the past Sorbitol 70% solution 30 mg 2 times daily,  Nasonex 50 mcg nasal spray 2 sprays once daily, Valium 5 mg 1/2 tablet by  mouth b.i.d.   DRUG ALLERGIES:  None.   POSITIVE PHYSICAL FINDINGS:  The patient's full physical examination will be  done by the internal medicine service and will be noted in the record.   REVIEW OF SYSTEMS:  Today is remarkable for the patient's complaints of  lower abdominal discomfort for the past week.  She denies any fever or  chills.  She says she has not been voiding regularly but is able to void on  small amounts and never feels like she gets empty.  Vital signs at the time  of admission:  She is  approximately 5 feet tall, maybe a little bit less.  This is an estimate on our part.  She weighs, we are estimating,  approximately 125 pounds.  We do not have the capability of getting an  accurate weight and height on her here.  Vital signs, temperature 97.4,  pulse 93, respirations 20, blood pressure 135/98.   PHYSICAL EXAMINATION:  She does appear to have a distended abdomen and has  some discomfort with palpation but is not guarding or in acute distress.  Some very mild, possibly left CVA tenderness with palpations.  Today, we  have done a cauterization on her and obtained 475 mL of cloudy yellow urine  with a bit of strong smelling.   DIAGNOSTIC STUDIES:  CBC within normal limits.  WBC was 4500, hemoglobin  14.6, hematocrit 44.1.  Metabolic panel revealed her electrolytes are within  normal limits.  BUN 7, creatinine 0.6.  Urinalysis is pending.  TSH is  pending.   MENTAL STATUS EXAM:  This is a fully alert female who is pleasant and  cooperative with an anxious affect and she does appear somewhat  uncomfortable.  Generally is cooperative.  Good eye contact.  Speech is  fairly clear.  She does have some speech distortion and some mild pressure  but she initiates conversation, normal pace and amount.  Mood is anxious,  she is somewhat irritable, frustrated.  Mood is a bit labile.  Thought  process is positive possible for some vague paranoia.  She has had some  thought agitation particularly related to her physical symptoms.  She is  tracking conversation well, no flight of ideas or tangentiality, no evidence  of homicidal thought, positive for suicidal thought, feeling miserable and  frustrated but no clear plan.  Cognitively she is intact to person, place  and situation and is accurate in time frame.   ADMISSION DIAGNOSIS:   AXIS I:  Schizoaffective disorder.   AXIS II:  Deferred.  AXIS III:  Cerebral palsy, rule out urinary tract infection, urinary  retention.   AXIS  IV:  Deferred.   AXIS V:  Current 36, past year 25.   INITIAL PLAN OF CARE:  Voluntarily admit the patient with q.15 minute checks  in place.  We  are going to decrease her Cymbalta to 30 mg daily, ask  internal medicine to see her today, to do a full physical on her and assist  with diagnosis of her abdominal complaint and we are going to obtain basic  labs on her including a cath urinary  specimen and ask the case manager to contact the rest home for some  additional information.  We will continue all her other routine labs.   ESTIMATED LENGTH OF STAY:  5-7 days.     Margaret A. Stephannie Peters                   Jeanice Lim, M.D.    MAS/MEDQ  D:  09/19/2003  T:  09/19/2003  Job:  [id number]

## 2010-11-27 NOTE — Discharge Summary (Signed)
NAMESHIVONNE, SCHWARTZMAN                           ACCOUNT NO.:  [id number]   MEDICAL RECORD NO.:  [id number]                   PATIENT TYPE:  IPS   LOCATION:  0405                                 FACILITY:  [facility]   PHYSICIAN:  Jeanice Lim, M.D.              DATE OF BIRTH:  02-03-1966   DATE OF ADMISSION:  06/19/2003  DATE OF DISCHARGE:  07/01/2003                                 DISCHARGE SUMMARY   IDENTIFYING DATA:  This is a 45 year old single Caucasian female voluntarily  admitted.  She presented to the hospital on referral from [facility] where she has resided since 1988.  She had an episode of  irritability with delusional thoughts, thinking that her father had been  killed and then she said she had thoughts of killing everybody and herself  and that she had been abusing people.   MEDICATIONS:  1. Valium 2.5 mg b.i.d.  2. Effexor XR 75 mg daily.  3. Provigil 300 mg daily.  4. Allegra 180 mg daily.  5. Protonix 40 mg b.i.d.  6. Fibercon.  7. MiraLax.  8. Sorbitol 70% 30 mL b.i.d.  9. Rhinocort nasal spray.  10.      Lexapro 30 mg daily.  11.      Cytomel 25 mcg one half daily.  12.      Colace 100 mg q.a.m. and 200 mg q.h.s.   DRUG ALLERGIES:  No known drug allergies.   PHYSICAL EXAMINATION:  GENERAL:  Essentially within normal limits.  NEUROLOGIC:  Nonfocal.   LABORATORY DATA:  Routine admission labs: Essentially within normal limits.   MENTAL STATUS EXAM:  Fully alert in no acute distress, calm, cooperative,  pleasant, polite, no complaints.  Positive response latency, expressive  dysfunction secondary to CP.  Appearing somewhat uncomfortable, distressed,  depressed.  Reporting that she was sad, suicidal, and that she had been  abusing people, possibly hurting and killing people and abusing people even  here in the hospital despite her inability to get out of a wheelchair,  consistent with delusions which are egocentonic with depressed mood.  Cognitive: Intact.  Poor historian.  Judgment and insight were poor.   ADMISSION DIAGNOSES:   AXIS I:  1. Major depressive disorder, not otherwise specified.  2. Rule out psychosis.   AXIS II:  Deferred.   AXIS III:  1. Cerebral palsy.  2. Rule out urinary tract infection.   AXIS IV:  Moderate problems with primary support group.   AXIS V:  20/52   HOSPITAL COURSE:  The patient was admitted, ordered routine p.r.n.  medications, underwent further monitoring, and was encouraged to participate  in individual, group, and milieu therapy.  The patient was seen by internal  medicine due to compromised state to monitor medical status closely in  addition to optimizing psychotropics.  The patient's past medical history  was obtained regarding psychiatric medication therapy and multiple  drastic  changes in her medications when she had been hospitalized two times at [facility], apparently doing worse each time after leaving the hospital and had  done quite well on a medication regimen for some time after this regimen was  obtained from group home staff who appear to be quite supportive and caring  about the patient.  The patient was restabilized on these medications and  gradually improved with increase in reality testing, improvement in mood,  decrease in paranoia, improvement in responsiveness.  She tolerated the  medication without any side effects, clearly doing much better, robustly  responding to medication changes.   CONDITION ON DISCHARGE:  She was discharged in improved condition.  Mood was  more euthymic.  Affect: Brighter.  No overt psychotic symptoms, no  delusions, no dangerous ideations.   DISCHARGE MEDICATIONS:  1. Valium 5 mg one half q.a.m. and one q.h.s.  2. Prozac 20 mg two q.a.m.  3. Trileptal 300 mg one half t.i.d.  4. Ambien 10 mg one q.h.s. p.r.n. insomnia.  5. Colace 100 mg b.i.d.  6. ___________ two tablets three times a day.  7. Nasonex 50 mcg two sprays  every day.  8. Claritin 10 mg q.a.m.  9. Hydrocortisone 1% cream 1.5 g b.i.d. topical.  10.      Symmetrel 100 mg b.i.d.  11.      Sorbitol 70% solution 30 mL b.i.d.  12.      Peridex 0.12% oral rinse 5 mL b.i.d.  13.      MiraLax powder 17 g daily.  14.      Fibercon 500 mg t.i.d.  15.      Protonix 40 mg b.i.d.  16.      Risperdal 1 mg one and a half q.h.s.   FOLLOW UP:  The patient was discharged to follow up at the [facility] with Nawar M. Alnaquib, M.D., on January 4 at 11 a.m.   DISCHARGE DIAGNOSES:   AXIS I:  1. Major depressive disorder, not otherwise specified.  2. Rule out psychosis.   AXIS II:  Deferred.   AXIS III:  1. Cerebral palsy.  2. Rule out urinary tract infection.   AXIS IV:  Moderate problems with primary support group.   AXIS V:  Global assessment of functioning on discharge was 55.                                               Jeanice Lim, M.D.    JEM/MEDQ  D:  07/24/2003  T:  07/25/2003  Job:  [id number]

## 2010-11-27 NOTE — Discharge Summary (Signed)
NAMECAMILLE, Kelly Dominguez                           ACCOUNT NO.:  [id number]   MEDICAL RECORD NO.:  [id number]                   PATIENT TYPE:  IPS   LOCATION:  0300                                 FACILITY:  [facility]   PHYSICIAN:  Jeanice Lim, M.D.              DATE OF BIRTH:  07-31-1965   DATE OF ADMISSION:  09/03/2003  DATE OF DISCHARGE:  09/26/2003                                 DISCHARGE SUMMARY   IDENTIFYING DATA:  This is a 45 year old Caucasian female, single,  voluntarily admitted, reporting that the group home staff are getting her  agitated and trying to make her do things.  She had been reportedly striking  staff, trying to run over them with her wheelchair having poor frustration  tolerance, delusional thinking, and feeling paranoid.  Patient has cerebral  palsy and has been living at [facility].  Becoming more  agitated over the last one to two weeks.  Anxious, panicky, and depressed.  Refusing to get out of bed.  Shouting at the staff, swinging her arms and  trying to strike them.  Also, complaining of abdominal discomfort.  Reporting nobody understanding, nobody is responding to her complaints, and  feeling pain when she would try to get up or move.  Abdomen was clearly  distended at the time of admission.  The patient recently was followed by  Dr. Mariana Single and treated for schizoaffective disorder.   PAST MEDICAL HISTORY:  Dr. Aldean Ast and Dr. Rodrigo Ran are her primary  care physicians.  Medical conditions include cerebral palsy, multiple  chronic recurrent UTIs, history of constipation, and urinary incontinence.   MEDICATIONS PRIOR TO ADMISSION:  Cymbalta, which had been increased February  3 to 60 mg, MiraLax, Colace, fiber laxatives, Symmetrel, Protonix, Desonide,  baclofen, Trileptal, Seroquel, Claritin, sorbitol, Nasonex, Valium.   DRUG ALLERGIES:  No known drug allergies.                                               Jeanice Lim,  M.D.    Lovie Macadamia  D:  10/13/2003  T:  10/14/2003  Job:  [id number]

## 2010-11-27 NOTE — Discharge Summary (Signed)
Kelly Dominguez, Kelly Dominguez                           ACCOUNT NO.:  [id number]   MEDICAL RECORD NO.:  [id number]                   PATIENT TYPE:  IPS   LOCATION:  0300                                 FACILITY:  [facility]   PHYSICIAN:  Jeanice Lim, M.D.              DATE OF BIRTH:  1966-03-22   DATE OF ADMISSION:  09/03/2003  DATE OF DISCHARGE:  09/26/2003                                 DISCHARGE SUMMARY   IDENTIFYING DATA:  This is a 45 year old Caucasian female, single,  voluntarily admitted.   CHIEF COMPLAINT:  They are getting me agitated and trying to make me do  things.  Reportedly, patient had been striking staff, trying to run over  them with her wheelchair, having poor frustration tolerance and possible  delusional thinking, feeling paranoid.  This is the second Kelly Dominguez  hospitalization at [facility] for this 45 year old patient who  has cerebral palsy and has been living at [facility].  Over the last 1-2 weeks she has become more agitated and  irritable, feeling anxious, panicky, depressed, refusing to get out of bed,  shouting at staff, swinging arms, trying to strike them.  Also complaining  of abdominal discomfort and reporting that nobody understands, feeling  uncomfortable when she tries to move.  She had thoughts of wanting to end  her life and wanted to turn her wheelchair over as a method of hurting  herself.  The patient had recently been followed by Dr. Mariana Single at [facility], diagnosed with schizoaffective  disorder.   PAST MEDICAL HISTORY:  Dr. Aldean Ast and Dr. Rodrigo Ran are her primary  care physicians.  Medical problems include:  1. Cerebral palsy.  2. Multiple chronic recurrent UTIs.  3. A history of constipation.  4. Urinary incontinence.  Medical history is significant for:  1. A history of abdominal hysterectomy.  2. Tendon and muscle surgeries related to cerebral palsy.  3.  No past history of seizures.   MEDICATIONS PRIOR TO ADMISSION:  Patient had been taking:  1. Cymbalta 60 mg which had been increased from 30 on August 15, 2003.  2. MiraLax.  3. Colace.  4. Fiber laxative.  5. Symmetrel.  6. Protonix.  7. Desonide.  8. Baclofen.  9. Trileptal.  10.      Seroquel.  11.      Claritin.  12.      Sorbitol.  13.      Nasonex.  14.      Valium.   ALLERGIES:  NO KNOWN DRUG ALLERGIES.   PHYSICAL EXAM:  Essentially within normal limits.  Consistent with cerebral  palsy.  Neurologically, no focal findings consistent with cerebral palsy, no  change since past admission.  Very mild CVA tenderness and possible  distended abdomen with some discomfort on the physical exam.  Cathing the  patient, we obtained 475 mL of  cloudy yellow urine which was strong  smelling.   ROUTINE ADMISSION LABS:  White count was 4500, hemoglobin and hematocrit  within normal limits.  BUN and creatinine within normal limits.  Urinalysis  positive for possible UTI.  Other labs essentially within normal limits.   MENTAL STATUS EXAM:  Fully alert female, pleasant, cooperative, anxious.  Affect appeared somewhat uncomfortable.  Generally cooperative.  Fair to  good eye contact.  Speech fairly clear.  There is some speech distortion,  mild pressure but initiates conversation.  Mood was anxious, irritable,  frustrated.  Mood was a bit labile.  Thought process positive for possible  vague paranoia and questionable delusions.  Patient has had thought  agitation related to physical complaints.  In conversation, she was  tracking, no flight of ideas or tangentiality.  No homicidal thoughts.  Positive for suicidal thoughts, mostly passive.  Cognitively intact.  Judgment, insight fair.   ADMITTING DIAGNOSES:   AXIS I:  Schizoaffective disorder.   AXIS II:  None.   AXIS III:  Cerebral palsy, rule out urinary tract infection, urinary  retention.   AXIS IV:  Moderate, possible medical  problems not able to be closely  monitored in current living situation.   AXIS V:  Global Assessment of Functioning 36/55.   HOSPITAL COURSE:  The patient was admitted and ordered p.r.n. routine  medications, underwent further monitoring.  Was encouraged  to participate  in individual, group and milieu therapy.  The patient was resumed on  medications.  Medical problems were addressed and Internal Medicine consult  was obtained with close evaluation of abdominal distention, possible  constipation, urinary retention.  Patient had in-and-out caths with post-  void residuals and I&Os to ensure adequate urine output.  All medical  recommendations were followed from a psychiatric standpoint.  The patient  was gradually adjusted on medications, decreasing and stopping any medicines  that might be worsening irritability or agitation, including __________.  Patient clearly has some suspicious thinking and delusional thoughts at  times, therefore, Risperdal was adjusted, Valium was adjusted to minimize  sedation but to help with anxiety and Cymbalta was adjusted and, due to  patient's apparent lack of response and possible worsening of symptoms,  Cymbalta was gradually switched to Celexa which is an antidepressant less  likely to interact with other medications and cause some of the side effects  that she may have been having from Cymbalta.  The patient was monitored for  TD and EPS and Symmetrel was continued for prophylaxis, possible EPS  symptoms.  The patient was treated for constipation and urinary retention  and a behavioral plan was also developed to assist the patient managing  frustration.  Although, some of the frustration appeared to be related to  physical discomfort which was explainable in light of urinary retention and  UTI.  The patient's mood gradually improved as she was stabilized on medication.  She reported not wanting to return to [facility], feeling that  they were not able  to care for her and that they may not like her.  Although  some thoughts were distorted, her medical issues did suggest that she may be  better cared for at a nursing facility.  Therefore, this was pursued and  disposition was worked on by case management as medications were further  adjusted.  Internal Medicine followup was continued and the patient was  discharged in improved condition with a more stable mood, brighter affect,  no overt psychotic symptoms, no hallucinations, no  preoccupation with  delusional thoughts or significant thought distortion.  She appeared calm  and comfortable with no acute medical complaints.  Internal Medicine made  followup recommendations including possible swallowing study due to some  choking when swallowing food, no dysphagia.  However, Speech Pathology could  evaluate patient as well as Physical Therapy and Occupational Therapy  evaluation which the patient would benefit from.  The patient was discharged  in improved condition without overt psychotic symptoms and no dangerous  ideation in improved condition.   Given medication education and discharged on:  1. Valium 5 mg one at 8 p.m.  2. Celexa 40 mg q.a.m.  3. Ambien 10 mg q.h.s. p.r.n. insomnia.  4. Urecholine 10 mg 2-1/2 tabs, that is 25 mg, at 6 a.m., 12 p.m., 6 p.m.     and 12 a.m.  5. Trileptal 150 mg q.a.m. and q.h.s.  6. Selsun Blue Shampoo on Tuesdays, Thursdays and Saturdays.  7. Monistat 2% vaginal cream one application twice a day.  8. Risperdal 0.5 mg 1-1/2 at 8 p.m., that is a total of 0.75 mg at 8 p.m.  9. Lioresal 10 mg 2 tabs three times a day.  10.      Tridesilon 0.05% ointment, one application, topical twice a day.  11.      Potassium.  12.      K-Dur, 20 mEq q.a.m.  13.      Senokot one tablet twice a day.  14.      Flomax 0.4 mg SA one in the morning.  15.      MiraLax powder 17 gm in 8 ounces of water, one per day.  16.      Colace 100 mg twice a day.  17.      FiberCon  625 mg three times a day.  18.      Symmetrel 100 mg 1 twice a day.  19.      Protonix 40 mg 1 twice a day.   The patient again was to have a PT and OT evaluation, a swallowing study  after evaluation by Speech Pathology, was to followup with Dr. Truett Perna at  [facility] and ACT Team will also follow at [facility] for therapy and  patient was to be scheduled for this followup by [facility] staff as well as  for the swallowing study and for close medical followup of her medical  conditions which include recurrent UTI and episodic constipation and  discomfort from abdominal distention often from urinary retention.  She is  not having urinary retention at this time and has been treated for a UTI  which she has been treated multiple times in the past for.  She should be  monitored for UTI at least weekly if not bi-weekly.   DISCHARGE DIAGNOSES:   AXIS I:  Schizoaffective disorder.   AXIS II:  None.   AXIS III:  Cerebral palsy, rule out urinary tract infection, urinary  retention.   AXIS IV:  Moderate, possible medical problems not able to be closely  monitored in current living situation.   AXIS V:  Global Assessment of Functioning on discharge 50-55.                                               Jeanice Lim, M.D.    Lovie Macadamia  D:  09/26/2003  T:  09/26/2003  Job:  [id number]

## 2011-04-19 LAB — CBC
HCT: 38.7
HCT: 38.8
Hemoglobin: 13.2
Hemoglobin: 13.4
MCHC: 33.9
MCHC: 34.1
MCHC: 34.4
MCHC: 34.7
MCV: 84.6
MCV: 84.6
Platelets: 412 — ABNORMAL HIGH
Platelets: 443 — ABNORMAL HIGH
RBC: 3.95
RBC: 4.57
RDW: 13.2
RDW: 13.4
WBC: 14.5 — ABNORMAL HIGH

## 2011-04-19 LAB — COMPREHENSIVE METABOLIC PANEL
ALT: 34
AST: 13
AST: 20
Albumin: 2.6 — ABNORMAL LOW
Albumin: 2.8 — ABNORMAL LOW
BUN: 4 — ABNORMAL LOW
CO2: 27
Calcium: 8.4
Calcium: 8.6
Calcium: 8.7
Chloride: 101
Creatinine, Ser: 0.33 — ABNORMAL LOW
Creatinine, Ser: 0.46
Creatinine, Ser: 0.49
GFR calc Af Amer: >60
GFR calc non Af Amer: >60
Glucose, Bld: 128 — ABNORMAL HIGH
Glucose, Bld: 96
Potassium: 3.1 — ABNORMAL LOW
Sodium: 137
Total Bilirubin: 0.7
Total Protein: 5.9 — ABNORMAL LOW
Total Protein: 6.6

## 2011-04-19 LAB — BASIC METABOLIC PANEL
BUN: 9
CO2: 27
CO2: 31
Calcium: 8.3 — ABNORMAL LOW
Calcium: 8.8
Chloride: 102
Creatinine, Ser: 0.37 — ABNORMAL LOW
Creatinine, Ser: 0.45
GFR calc Af Amer: >60
GFR calc Af Amer: >60
GFR calc non Af Amer: >60

## 2011-04-19 LAB — URINALYSIS, ROUTINE W REFLEX MICROSCOPIC
Bilirubin Urine: NEGATIVE
Leukocytes, UA: NEGATIVE
Nitrite: NEGATIVE
Specific Gravity, Urine: 1.015
Urobilinogen, UA: 0.2

## 2011-04-19 LAB — DIFFERENTIAL
Basophils Absolute: 0.1
Eosinophils Absolute: 0.2
Eosinophils Relative: 2
Lymphocytes Relative: 20
Lymphs Abs: 2.8
Neutrophils Relative %: 73

## 2011-04-19 LAB — MAGNESIUM: Magnesium: 2

## 2011-04-19 LAB — PHOSPHORUS
Phosphorus: 3.6
Phosphorus: 4.1

## 2011-04-19 LAB — TRIGLYCERIDES
Triglycerides: 102
Triglycerides: 107

## 2011-04-20 LAB — COMPREHENSIVE METABOLIC PANEL
ALT: 22
AST: 15
Alkaline Phosphatase: 73
CO2: 26
Calcium: 8.8
GFR calc Af Amer: >60
GFR calc non Af Amer: >60
Potassium: 2.3 — CL
Sodium: 137
Total Protein: 6.8

## 2011-04-20 LAB — URINALYSIS, ROUTINE W REFLEX MICROSCOPIC
Bilirubin Urine: NEGATIVE
Glucose, UA: NEGATIVE
Glucose, UA: NEGATIVE
Ketones, ur: NEGATIVE
Protein, ur: 100 — AB
Urobilinogen, UA: 0.2
pH: 5.5

## 2011-04-20 LAB — CBC
HCT: 32.9 — ABNORMAL LOW
HCT: 42
HCT: 42.8
Hemoglobin: 12.6
Hemoglobin: 13.8
Hemoglobin: 14.1
MCHC: 33.6
MCHC: 34.3
MCV: 85.6
MCV: 86.7
Platelets: 314
Platelets: 322
RBC: 3.84 — ABNORMAL LOW
RBC: 4.31
RBC: 4.72
RDW: 13.3
RDW: 13.7
WBC: 11.1 — ABNORMAL HIGH
WBC: 12.1 — ABNORMAL HIGH
WBC: 19.3 — ABNORMAL HIGH
WBC: 7.2

## 2011-04-20 LAB — URINE MICROSCOPIC-ADD ON

## 2011-04-20 LAB — URINE CULTURE
Colony Count: NO GROWTH
Culture: NO GROWTH
Special Requests: NEGATIVE

## 2011-04-20 LAB — DIFFERENTIAL
Basophils Relative: 0
Eosinophils Absolute: 0 — ABNORMAL LOW
Eosinophils Relative: 0
Lymphs Abs: 1.5
Monocytes Relative: 5

## 2011-04-20 LAB — BASIC METABOLIC PANEL
BUN: 10
BUN: 6
CO2: 24
Calcium: 7.7 — ABNORMAL LOW
Chloride: 102
Chloride: 108
Creatinine, Ser: 0.48
GFR calc Af Amer: >60
GFR calc Af Amer: >60
GFR calc non Af Amer: >60
GFR calc non Af Amer: >60
GFR calc non Af Amer: >60
GFR calc non Af Amer: >60
Glucose, Bld: 105 — ABNORMAL HIGH
Glucose, Bld: 108 — ABNORMAL HIGH
Potassium: 3 — ABNORMAL LOW
Potassium: 3.1 — ABNORMAL LOW
Potassium: 3.7
Potassium: 3.8
Potassium: 3.9
Sodium: 133 — ABNORMAL LOW
Sodium: 134 — ABNORMAL LOW
Sodium: 136

## 2011-04-29 LAB — HEMOGLOBIN AND HEMATOCRIT, BLOOD
HCT: 41.8
Hemoglobin: 13.6

## 2011-04-29 LAB — CBC
MCV: 81.1
Platelets: 250
RDW: 15.1 — ABNORMAL HIGH
WBC: 13.6 — ABNORMAL HIGH

## 2011-04-29 LAB — BASIC METABOLIC PANEL
BUN: 6
Chloride: 104
Creatinine, Ser: 0.35 — ABNORMAL LOW
Glucose, Bld: 156 — ABNORMAL HIGH

## 2012-03-04 ENCOUNTER — Encounter (HOSPITAL_COMMUNITY): Payer: Self-pay | Admitting: *Deleted

## 2012-03-04 ENCOUNTER — Emergency Department (HOSPITAL_COMMUNITY): Payer: Medicare Other

## 2012-03-04 ENCOUNTER — Emergency Department (HOSPITAL_COMMUNITY)
Admission: EM | Admit: 2012-03-04 | Discharge: 2012-03-04 | Disposition: A | Discharge status: Home / Self Care | Payer: Medicare Other | Attending: Emergency Medicine | Admitting: Emergency Medicine

## 2012-03-04 DIAGNOSIS — R0789 Other chest pain: Secondary | ICD-10-CM

## 2012-03-04 DIAGNOSIS — F411 Generalized anxiety disorder: Secondary | ICD-10-CM | POA: Insufficient documentation

## 2012-03-04 DIAGNOSIS — R071 Chest pain on breathing: Secondary | ICD-10-CM | POA: Insufficient documentation

## 2012-03-04 DIAGNOSIS — W19XXXA Unspecified fall, initial encounter: Secondary | ICD-10-CM

## 2012-03-04 DIAGNOSIS — F259 Schizoaffective disorder, unspecified: Secondary | ICD-10-CM | POA: Insufficient documentation

## 2012-03-04 DIAGNOSIS — Z882 Allergy status to sulfonamides status: Secondary | ICD-10-CM | POA: Insufficient documentation

## 2012-03-04 DIAGNOSIS — W07XXXA Fall from chair, initial encounter: Secondary | ICD-10-CM | POA: Insufficient documentation

## 2012-03-04 DIAGNOSIS — G809 Cerebral palsy, unspecified: Secondary | ICD-10-CM | POA: Insufficient documentation

## 2012-03-04 DIAGNOSIS — M79609 Pain in unspecified limb: Secondary | ICD-10-CM | POA: Insufficient documentation

## 2012-03-04 DIAGNOSIS — K219 Gastro-esophageal reflux disease without esophagitis: Secondary | ICD-10-CM | POA: Insufficient documentation

## 2012-03-04 HISTORY — DX: Reserved for inherently not codable concepts without codable children: IMO0001

## 2012-03-04 HISTORY — DX: Major depressive disorder, single episode, unspecified: F32.9

## 2012-03-04 HISTORY — DX: Anxiety disorder, unspecified: F41.9

## 2012-03-04 HISTORY — DX: Depression, unspecified: F32.A

## 2012-03-04 HISTORY — DX: Schizoaffective disorder, bipolar type: F25.0

## 2012-03-04 HISTORY — DX: Cerebral palsy, unspecified: G80.9

## 2012-03-04 HISTORY — DX: Gastro-esophageal reflux disease without esophagitis: K21.9

## 2012-03-04 HISTORY — DX: Neuromuscular dysfunction of bladder, unspecified: N31.9

## 2012-03-04 HISTORY — DX: Schizoaffective disorder, unspecified: F25.9

## 2012-03-04 MED ORDER — HYDROCODONE-ACETAMINOPHEN 5-325 MG PO TABS: 1.0000 | ORAL_TABLET | Freq: Four times a day (QID) | ORAL | Status: AC | PRN

## 2012-03-04 MED ORDER — HYDROCODONE-ACETAMINOPHEN 5-325 MG PO TABS
1.0000 | ORAL_TABLET | Freq: Once | ORAL | Status: AC
  Administered 2012-03-04: 1 via ORAL
  Filled 2012-03-04: qty 1

## 2012-03-04 NOTE — ED Provider Notes (Signed)
History     CSN: [id number]  Arrival date & time 03/04/12  1650   First MD Initiated Contact with Patient 03/04/12 1722      Chief Complaint  Patient presents with  . Fall    (Consider location/radiation/quality/duration/timing/severity/associated sxs/prior treatment) The history is provided by the patient and a caregiver.   as reported that the patient slid out of her shower chair yesterday when being showered by staff at the group home.  The patient is a history of cerebral palsy and schizoaffective schizophrenia.  She is wheelchair-bound.  His reported that her right leg went underneath her and behind her while in the shower and then she fell towards her right side.  Today BP she began complaining of slightly more right-sided chest pain and shoulder pain as well as right thigh pain.  She is in a wheelchair at baseline.  Caregiver who is with her reports that anytime the patient becomes upset she complains of pain and this is not a new complaint for her.  His reported that she is not complaining of pain in the ER earlier today.  Patient was brought to the emergency [facility] by her caregiver for evaluation.  Patient reports mild pain in the right side of her chest wall.  Past Medical History  Diagnosis Date  . Cerebral palsy   . Anxiety   . Schizo affective schizophrenia   . Depression   . Reflux   . Neurogenic bladder     No past surgical history on file.  No family history on file.  History  Substance Use Topics  . Smoking status: Never Smoker   . Smokeless tobacco: Not on file  . Alcohol Use: No    OB History    Grav Para Term Preterm Abortions TAB SAB Ect Mult Living                  Review of Systems  All other systems reviewed and are negative.    Allergies  Cymbalta; Septra; Sulfa antibiotics; and Trilafon  Home Medications   Current Outpatient Rx  Name Route Sig Dispense Refill  . ACETAMINOPHEN 500 MG PO TABS Oral Take 500 mg by mouth every 12  (twelve) hours as needed. Pain    . BACLOFEN 10 MG PO TABS Oral Take 10 mg by mouth 2 (two) times daily.    Marland Kitchen BENZTROPINE MESYLATE 0.5 MG PO TABS Oral Take 0.5 mg by mouth 2 (two) times daily.    Marland Kitchen BISACODYL 10 MG RE SUPP Rectal Place 10 mg rectally daily.    Marland Kitchen CALCIUM CARBONATE 600 MG PO TABS Oral Take 600 mg by mouth 2 (two) times daily with a meal.    . VITAMIN D 400 UNITS PO CAPS Oral Take 400 Units by mouth daily.    Marland Kitchen DIVALPROEX SODIUM 500 MG PO TBEC Oral Take 500 mg by mouth every morning.    Marland Kitchen ESOMEPRAZOLE MAGNESIUM 40 MG PO CPDR Oral Take 40 mg by mouth daily before breakfast.    . PRO-STAT 64 PO LIQD Oral Take 30 mLs by mouth daily.    Marland Kitchen FLUTICASONE FUROATE 27.5 MCG/SPRAY NA SUSP Nasal Place 2 sprays into the nose daily.    . IBUPROFEN 200 MG PO TABS Oral Take 200 mg by mouth every 6 (six) hours as needed.    Marland Kitchen LORATADINE 10 MG PO TABS Oral Take 10 mg by mouth daily.    Marland Kitchen LORAZEPAM 1 MG PO TABS Oral Take 1 mg by mouth 3 (  three) times daily.    Marland Kitchen METHENAMINE HIPPURATE 1 G PO TABS Oral Take 500 mg by mouth 2 (two) times daily with a meal.    . POLYETHYLENE GLYCOL 3350 PO PACK Oral Take 17 g by mouth daily.    . SENNOSIDES 8.6 MG PO TABS Oral Take 1 tablet by mouth 2 (two) times daily.    Marland Kitchen VITAMIN C 500 MG PO TABS Oral Take 500 mg by mouth daily.    Marland Kitchen ZIPRASIDONE HCL 40 MG PO CAPS Oral Take 40 mg by mouth every morning.    Marland Kitchen HYDROCODONE-ACETAMINOPHEN 5-325 MG PO TABS Oral Take 1 tablet by mouth every 6 (six) hours as needed for pain. 6 tablet 0    BP 108/63  Pulse 80  Temp 98.5 F (36.9 C) (Oral)  Resp 16  SpO2 99%  Physical Exam  Nursing note and vitals reviewed. Constitutional: She is oriented to person, place, and time. She appears well-developed and well-nourished. No distress.  HENT:  Head: Normocephalic and atraumatic.  Eyes: EOM are normal.  Neck: Normal range of motion.  Cardiovascular: Normal rate, regular rhythm and normal heart sounds.   Pulmonary/Chest: Effort  normal and breath sounds normal.  Abdominal: Soft. She exhibits no distension. There is no tenderness.  Musculoskeletal:       Chronic contractures of her bilateral lower extremities.  Full range of motion right knee and right hip.  Right thigh where her pain was was examined and there is no deformity ecchymosis or swelling.  No focal tenderness.  Full resolution of right shoulder.  No tenderness at the right a.c. joint.  Right clavicle was palpated and is normal in palpation.  Mild tenderness to right lateral chest wall without bruising.  Normal right scapula palpated.  Good strength in right hand.  Normal right radial pulse the  Neurological: She is alert and oriented to person, place, and time.  Skin: Skin is warm and dry.  Psychiatric: She has a normal mood and affect. Judgment normal.    ED Course  Procedures (including critical care time)  Labs Reviewed - No data to display Dg Chest 2 View  03/04/2012  *RADIOLOGY REPORT*  Clinical Data: 46 year old female status post fall.  Cerebral palsy.  CHEST - 2 VIEW  Comparison: 07/08/2010 and earlier.  Findings: Lordotic view and lower lung volumes on both views. Cardiac size at the upper limits of normal. Other mediastinal contours are within normal limits.  No pneumothorax, pulmonary edema, pleural effusion or confluent pulmonary opacity.  Visualized osseous structures appear stable.  IMPRESSION: Lower lung volumes. No acute cardiopulmonary abnormality or acute traumatic injury identified.   Original Report Authenticated By: Harley [facility], M.D.     I personally reviewed the imaging tests through PACS system  I reviewed available ER/hospitalization records thought the EMR   1. Fall   2. Right-sided chest wall pain       MDM  Patient is well-appearing.  Plain film of her chest is normal.  She has full range of motion of her right leg and I do not believe she has a fracture there.  She is nonambulatory at baseline.  Discharge home with PCP  followup.  Short course of pain medicine.  Vital signs normal.        Lyanne Co, MD 03/04/12 2157

## 2012-03-04 NOTE — ED Notes (Signed)
Patient states that she fell from her shower chair and now she c/o left upper thigh pain and shoulder tenderness.  Patient denies denies hurting her head

## 2012-03-04 NOTE — ED Notes (Signed)
Saltine crackers given.

## 2015-10-30 ENCOUNTER — Emergency Department (HOSPITAL_COMMUNITY): Payer: Medicare Other

## 2015-10-30 ENCOUNTER — Encounter (HOSPITAL_COMMUNITY): Payer: Self-pay | Admitting: Emergency Medicine

## 2015-10-30 ENCOUNTER — Emergency Department (HOSPITAL_COMMUNITY)
Admission: EM | Admit: 2015-10-30 | Discharge: 2015-10-30 | Disposition: A | Discharge status: Home / Self Care | Payer: Medicare Other | Attending: Emergency Medicine | Admitting: Emergency Medicine

## 2015-10-30 DIAGNOSIS — Y999 Unspecified external cause status: Secondary | ICD-10-CM | POA: Diagnosis not present

## 2015-10-30 DIAGNOSIS — M542 Cervicalgia: Secondary | ICD-10-CM | POA: Insufficient documentation

## 2015-10-30 DIAGNOSIS — G809 Cerebral palsy, unspecified: Secondary | ICD-10-CM | POA: Diagnosis not present

## 2015-10-30 DIAGNOSIS — Z791 Long term (current) use of non-steroidal anti-inflammatories (NSAID): Secondary | ICD-10-CM | POA: Insufficient documentation

## 2015-10-30 DIAGNOSIS — M160 Bilateral primary osteoarthritis of hip: Secondary | ICD-10-CM | POA: Insufficient documentation

## 2015-10-30 DIAGNOSIS — Y9210 Unspecified residential institution as the place of occurrence of the external cause: Secondary | ICD-10-CM | POA: Diagnosis not present

## 2015-10-30 DIAGNOSIS — Y92199 Unspecified place in other specified residential institution as the place of occurrence of the external cause: Secondary | ICD-10-CM | POA: Diagnosis not present

## 2015-10-30 DIAGNOSIS — W050XXA Fall from non-moving wheelchair, initial encounter: Secondary | ICD-10-CM | POA: Insufficient documentation

## 2015-10-30 DIAGNOSIS — Y939 Activity, unspecified: Secondary | ICD-10-CM | POA: Insufficient documentation

## 2015-10-30 DIAGNOSIS — M549 Dorsalgia, unspecified: Secondary | ICD-10-CM | POA: Diagnosis not present

## 2015-10-30 DIAGNOSIS — F259 Schizoaffective disorder, unspecified: Secondary | ICD-10-CM | POA: Diagnosis not present

## 2015-10-30 DIAGNOSIS — F329 Major depressive disorder, single episode, unspecified: Secondary | ICD-10-CM | POA: Insufficient documentation

## 2015-10-30 DIAGNOSIS — M25551 Pain in right hip: Secondary | ICD-10-CM | POA: Diagnosis not present

## 2015-10-30 DIAGNOSIS — M79604 Pain in right leg: Secondary | ICD-10-CM | POA: Diagnosis present

## 2015-10-30 MED ORDER — ACETAMINOPHEN 500 MG PO TABS: 1000.0000 mg | ORAL_TABLET | Freq: Four times a day (QID) | ORAL | Status: AC | PRN

## 2015-10-30 MED ORDER — ACETAMINOPHEN 500 MG PO TABS
1000.0000 mg | ORAL_TABLET | Freq: Once | ORAL | Status: AC
  Administered 2015-10-30: 1000 mg via ORAL
  Filled 2015-10-30: qty 2
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
## 2015-10-30 NOTE — ED Notes (Signed)
Patient transported to radiology
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
## 2015-10-30 NOTE — ED Notes (Signed)
Discharge instructions, follow up care, and rx x1 reviewed with patient and patient's mother. Patient and mother verbalized understanding.

## 2015-10-30 NOTE — Discharge Instructions (Signed)
Your x-ray does not show broken hip and the CT of your neck does not show serious injury. Continue to take Tylenol as needed for pain control. Ice hip as needed.  Return for worsening symptoms, including worsening pain, numbness or weakness or any other symptoms concerning to you.  Hip Pain Your hip is the joint between your upper legs and your lower pelvis. The bones, cartilage, tendons, and muscles of your hip joint perform a lot of work each day supporting your body weight and allowing you to move around. Hip pain can range from a minor ache to severe pain in one or both of your hips. Pain may be felt on the inside of the hip joint near the groin, or the outside near the buttocks and upper thigh. You may have swelling or stiffness as well.  HOME CARE INSTRUCTIONS   Take medicines only as directed by your health care provider.  Apply ice to the injured area:  Put ice in a plastic bag.  Place a towel between your skin and the bag.  Leave the ice on for 15-20 minutes at a time, 3-4 times a day.  Keep your leg raised (elevated) when possible to lessen swelling.  Avoid activities that cause pain.  Follow specific exercises as directed by your health care provider.  Sleep with a pillow between your legs on your most comfortable side.  Record how often you have hip pain, the location of the pain, and what it feels like. SEEK MEDICAL CARE IF:   You are unable to put weight on your leg.  Your hip is red or swollen or very tender to touch.  Your pain or swelling continues or worsens after 1 week.  You have increasing difficulty walking.  You have a fever. SEEK IMMEDIATE MEDICAL CARE IF:   You have fallen.  You have a sudden increase in pain and swelling in your hip. MAKE SURE YOU:   Understand these instructions.  Will watch your condition.  Will get help right away if you are not doing well or get worse.   This information is not intended to replace advice given to you by  your health care provider. Make sure you discuss any questions you have with your health care provider.   Document Released: 12/16/2009 Document Revised: 07/19/2014 Document Reviewed: 02/22/2013 Elsevier Interactive Patient Education [employer].
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
## 2016-02-06 ENCOUNTER — Other Ambulatory Visit (HOSPITAL_COMMUNITY): Payer: Self-pay | Admitting: Family Medicine

## 2016-02-06 DIAGNOSIS — R1310 Dysphagia, unspecified: Secondary | ICD-10-CM

## 2016-02-11 ENCOUNTER — Ambulatory Visit (HOSPITAL_COMMUNITY)
Admission: RE | Admit: 2016-02-11 | Discharge: 2016-02-11 | Disposition: A | Discharge status: Home / Self Care | Payer: Medicare Other | Source: Ambulatory Visit | Attending: Family Medicine | Admitting: Family Medicine

## 2016-02-11 DIAGNOSIS — F419 Anxiety disorder, unspecified: Secondary | ICD-10-CM | POA: Insufficient documentation

## 2016-02-11 DIAGNOSIS — F209 Schizophrenia, unspecified: Secondary | ICD-10-CM | POA: Insufficient documentation

## 2016-02-11 DIAGNOSIS — K219 Gastro-esophageal reflux disease without esophagitis: Secondary | ICD-10-CM | POA: Insufficient documentation

## 2016-02-11 DIAGNOSIS — R1310 Dysphagia, unspecified: Secondary | ICD-10-CM

## 2016-02-11 DIAGNOSIS — F329 Major depressive disorder, single episode, unspecified: Secondary | ICD-10-CM | POA: Insufficient documentation

## 2016-02-11 DIAGNOSIS — G809 Cerebral palsy, unspecified: Secondary | ICD-10-CM | POA: Insufficient documentation

## 2016-02-11 NOTE — Procedures (Signed)
Objective Swallowing Evaluation: Type of Study: MBS-Modified Barium Swallow Study  Patient Details  Name: Kelly Dominguez MRN: [id number] Date of Birth: 1965-07-28  Today's Date: 02/11/2016 Time: SLP Start Time (ACUTE ONLY): 1125-SLP Stop Time (ACUTE ONLY): 1146 SLP Time Calculation (min) (ACUTE ONLY): 21 min  Past Medical History:  Past Medical History:  Diagnosis Date  . Anxiety   . Cerebral palsy ([place])   . Depression   . Neurogenic bladder   . Reflux   . Schizo affective schizophrenia [facility])    Past Surgical History:  Past Surgical History:  Procedure Laterality Date  . HAND SURGERY    . LEG SURGERY     HPI: Pt is a 50 yr old from group home seen for outpatient MBS accompanied by caregiver/transporter who reports throat clearing during and after meals and difficulty masticating solids. PMH: cerebral palsy, anxiety, reflux, schizophrenia. Pt denies coughing during meals or with liquids. No recent pna or illnesses. Pt currently on puree diet due to insuffient mastication. MBS today for oropharyngeal assessment and potential upgrade from purees.    No Data Recorded  Assessment / Plan / Recommendation  CHL IP CLINICAL IMPRESSIONS 02/11/2016  Therapy Diagnosis WFL  Clinical Impression Pt exhibited functional oral and pharyngeal swallowing ability. Mastication with solid (graham cracker) was sufficient with adequate bolus control and transit without oral residue. Pharyngeal swallow initation was swift without laryngeal penetration or aspiration. Pharynx clear post swallow. MBS does not diagnose below level of UES however esophagus briefly viewed not revealing abnormalities. Although MBS does not capture possible fatigue throughout a meal, recommend texture upgrade to chopped and continue thin liquids. Educated pt to increase attention and vigilance during meal to adequately masticate prior to propelling bolus, do not talk immediately after swalllow, sit upright, straws allowed, small sips.  Single pill with thin (or if difficulty, whole in applesauce).   Impact on safety and function Mild aspiration risk      CHL IP TREATMENT RECOMMENDATION 02/11/2016  Treatment Recommendations Defer treatment plan to f/u with SLP     No flowsheet data found.  CHL IP DIET RECOMMENDATION 02/11/2016  SLP Diet Recommendations Dysphagia 2 (Fine chop) solids;Thin liquid  Liquid Administration via Straw;Cup  Medication Administration Whole meds with liquid  Compensations Slow rate;Small sips/bites;Minimize environmental distractions  Postural Changes Remain semi-upright after after feeds/meals (Comment);Seated upright at 90 degrees      CHL IP OTHER RECOMMENDATIONS 02/11/2016  Recommended Consults --  Oral Care Recommendations Oral care BID  Other Recommendations --      CHL IP FOLLOW UP RECOMMENDATIONS 02/11/2016  Follow up Recommendations Home health SLP      No flowsheet data found.         CHL IP ORAL PHASE 02/11/2016  Oral Phase WFL  Oral - Pudding Teaspoon --  Oral - Pudding Cup --  Oral - Honey Teaspoon --  Oral - Honey Cup --  Oral - Nectar Teaspoon --  Oral - Nectar Cup --  Oral - Nectar Straw --  Oral - Thin Teaspoon --  Oral - Thin Cup --  Oral - Thin Straw --  Oral - Puree --  Oral - Mech Soft --  Oral - Regular --  Oral - Multi-Consistency --  Oral - Pill WFL  Oral Phase - Comment --    CHL IP PHARYNGEAL PHASE 02/11/2016  Pharyngeal Phase WFL  Pharyngeal- Pudding Teaspoon --  Pharyngeal --  Pharyngeal- Pudding Cup --  Pharyngeal --  Pharyngeal- Honey Teaspoon --  Pharyngeal --  Pharyngeal- Honey Cup --  Pharyngeal --  Pharyngeal- Nectar Teaspoon --  Pharyngeal --  Pharyngeal- Nectar Cup --  Pharyngeal --  Pharyngeal- Nectar Straw --  Pharyngeal --  Pharyngeal- Thin Teaspoon --  Pharyngeal --  Pharyngeal- Thin Cup WFL  Pharyngeal --  Pharyngeal- Thin Straw WFL  Pharyngeal --  Pharyngeal- Puree --  Pharyngeal --  Pharyngeal- Mechanical Soft --   Pharyngeal --  Pharyngeal- Regular WFL  Pharyngeal --  Pharyngeal- Multi-consistency --  Pharyngeal --  Pharyngeal- Pill WFL  Pharyngeal --  Pharyngeal Comment --     CHL IP CERVICAL ESOPHAGEAL PHASE 02/11/2016  Cervical Esophageal Phase WFL  Pudding Teaspoon --  Pudding Cup --  Honey Teaspoon --  Honey Cup --  Nectar Teaspoon --  Nectar Cup --  Nectar Straw --  Thin Teaspoon --  Thin Cup --  Thin Straw --  Puree --  Mechanical Soft --  Regular --  Multi-consistency --  Pill --  Cervical Esophageal Comment --    CHL IP GO 02/11/2016  Functional Assessment Tool Used skilled clinical judgement  Functional Limitations Swallowing  Swallow Current Status [id number]) [place]  Swallow Goal Status [id number]) [facility]  Swallow Discharge Status [id number]) [place]  Motor Speech Current Status [id number]) (None)  Motor Speech Goal Status [id number]) (None)  Motor Speech Goal Status [id number]) (None)  Spoken Language Comprehension Current Status [id number]) (None)  Spoken Language Comprehension Goal Status [id number]) (None)  Spoken Language Comprehension Discharge Status [id number]) (None)  Spoken Language Expression Current Status [id number]) (None)  Spoken Language Expression Goal Status [id number]) (None)  Spoken Language Expression Discharge Status [id number]) (None)  Attention Current Status [id number]) (None)  Attention Goal Status [id number]) (None)  Attention Discharge Status [id number]) (None)  Memory Current Status [id number]) (None)  Memory Goal Status [id number]) (None)  Memory Discharge Status [id number]) (None)  Voice Current Status [id number]) (None)  Voice Goal Status [id number]) (None)  Voice Discharge Status [id number]) (None)  Other Speech-Language Pathology Functional Limitation [id number]) (None)  Other Speech-Language Pathology Functional Limitation Goal Status [id number]) (None)  Other Speech-Language Pathology Functional Limitation Discharge Status [phone]) (None)    Cranford Mon.Ed CCC-SLP Pager [phone]    Kelly Dominguez 02/11/2016, 3:42 PM

## 2016-03-08 ENCOUNTER — Other Ambulatory Visit: Payer: Self-pay | Admitting: Gastroenterology

## 2016-03-24 ENCOUNTER — Encounter (HOSPITAL_COMMUNITY): Payer: Self-pay | Admitting: *Deleted

## 2016-03-24 NOTE — Progress Notes (Signed)
Pt SDW-Pre-op call completed by pt nurse, Anderson Malta, [place]. Nurse denies that pt C/O SOB, chest pain, and being under the care of a cardiologist. Nurse denies that pt had a stress test, echo and cardiac cath. Nurse denies that pt had labs within the last 2 weeks. Nurse denies that pt had an EKG and chest x ray within the last year. Nurse made aware to stop administering  Aspirin, vitamins, fish oil and herbal medications. Do not take any NSAIDs ie: Ibuprofen, Advil, Naproxen, BC and Goody Powder or any medication containing Aspirin. Nurse verbalized understanding of all pre-op instructions.

## 2016-03-25 ENCOUNTER — Ambulatory Visit (HOSPITAL_COMMUNITY)
Admission: RE | Admit: 2016-03-25 | Discharge: 2016-03-25 | Disposition: A | Discharge status: Home / Self Care | Payer: Medicare Other | Source: Ambulatory Visit | Attending: Gastroenterology | Admitting: Gastroenterology

## 2016-03-25 ENCOUNTER — Ambulatory Visit (HOSPITAL_COMMUNITY): Payer: Medicare Other | Admitting: Anesthesiology

## 2016-03-25 ENCOUNTER — Encounter (HOSPITAL_COMMUNITY): Payer: Self-pay | Admitting: *Deleted

## 2016-03-25 ENCOUNTER — Encounter (HOSPITAL_COMMUNITY)
Admission: RE | Disposition: A | Discharge status: Home / Self Care | Payer: Self-pay | Source: Ambulatory Visit | Attending: Gastroenterology

## 2016-03-25 DIAGNOSIS — Z6841 Body Mass Index (BMI) 40.0 and over, adult: Secondary | ICD-10-CM | POA: Diagnosis not present

## 2016-03-25 DIAGNOSIS — D123 Benign neoplasm of transverse colon: Secondary | ICD-10-CM | POA: Diagnosis not present

## 2016-03-25 DIAGNOSIS — I10 Essential (primary) hypertension: Secondary | ICD-10-CM | POA: Diagnosis not present

## 2016-03-25 DIAGNOSIS — Z1211 Encounter for screening for malignant neoplasm of colon: Secondary | ICD-10-CM | POA: Insufficient documentation

## 2016-03-25 DIAGNOSIS — Z800 Family history of malignant neoplasm of digestive organs: Secondary | ICD-10-CM | POA: Insufficient documentation

## 2016-03-25 DIAGNOSIS — K648 Other hemorrhoids: Secondary | ICD-10-CM | POA: Diagnosis not present

## 2016-03-25 DIAGNOSIS — F419 Anxiety disorder, unspecified: Secondary | ICD-10-CM | POA: Diagnosis not present

## 2016-03-25 HISTORY — DX: Essential (primary) hypertension: I10

## 2016-03-25 HISTORY — DX: Contracture, unspecified joint: M24.50

## 2016-03-25 HISTORY — DX: Unspecified hearing loss, unspecified ear: H91.90

## 2016-03-25 HISTORY — PX: COLONOSCOPY WITH PROPOFOL: SHX5780

## 2016-03-25 HISTORY — DX: Unspecified convulsions: R56.9

## 2016-03-25 HISTORY — DX: Serous retinal detachment, unspecified eye: H33.20

## 2016-03-25 HISTORY — DX: Unspecified cataract: H26.9

## 2016-03-25 SURGERY — COLONOSCOPY WITH PROPOFOL
Anesthesia: Monitor Anesthesia Care

## 2016-03-25 MED ORDER — PROPOFOL 500 MG/50ML IV EMUL
INTRAVENOUS | Status: DC | PRN
  Administered 2016-03-25: 75 ug/kg/min via INTRAVENOUS

## 2016-03-25 MED ORDER — LACTATED RINGERS IV SOLN
INTRAVENOUS | Status: DC | PRN
  Administered 2016-03-25: 09:00:00 via INTRAVENOUS

## 2016-03-25 MED ORDER — LIDOCAINE HCL (CARDIAC) 20 MG/ML IV SOLN
INTRAVENOUS | Status: DC | PRN
  Administered 2016-03-25: 40 mg via INTRAVENOUS

## 2016-03-25 MED ORDER — PROPOFOL 10 MG/ML IV BOLUS
INTRAVENOUS | Status: DC | PRN
  Administered 2016-03-25 (×2): 10 mg via INTRAVENOUS

## 2016-03-25 MED ORDER — SODIUM CHLORIDE 0.9 % IV SOLN: INTRAVENOUS | Status: DC

## 2016-03-25 MED ORDER — LACTATED RINGERS IV SOLN
INTRAVENOUS | Status: DC
  Administered 2016-03-25: 08:00:00 via INTRAVENOUS

## 2016-03-25 NOTE — Anesthesia Preprocedure Evaluation (Addendum)
Anesthesia Evaluation  Patient identified by MRN, date of birth, ID band Patient awake    Reviewed: Allergy & Precautions, NPO status , Patient's Chart, lab work & pertinent test results  Airway Mallampati: II  TM Distance: >3 FB Neck ROM: Full    Dental no notable dental hx. (+) Poor Dentition, Dental Advisory Given   Pulmonary neg pulmonary ROS,    Pulmonary exam normal breath sounds clear to auscultation       Cardiovascular hypertension, Normal cardiovascular exam Rhythm:Regular Rate:Normal     Neuro/Psych Anxiety CP    GI/Hepatic negative GI ROS, Neg liver ROS,   Endo/Other  Morbid obesity  Renal/GU negative Renal ROS  negative genitourinary   Musculoskeletal negative musculoskeletal ROS (+)   Abdominal   Peds negative pediatric ROS (+)  Hematology negative hematology ROS (+)   Anesthesia Other Findings   Reproductive/Obstetrics negative OB ROS                            Anesthesia Physical Anesthesia Plan  ASA: III  Anesthesia Plan: MAC   Post-op Pain Management:    Induction: Intravenous  Airway Management Planned: Simple Face Mask  Additional Equipment:   Intra-op Plan:   Post-operative Plan:   Informed Consent: I have reviewed the patients History and Physical, chart, labs and discussed the procedure including the risks, benefits and alternatives for the proposed anesthesia with the patient or authorized representative who has indicated his/her understanding and acceptance.   Dental advisory given  Plan Discussed with: CRNA and Surgeon  Anesthesia Plan Comments:         Anesthesia Quick Evaluation

## 2016-03-25 NOTE — Anesthesia Postprocedure Evaluation (Signed)
Anesthesia Post Note  Patient: Kelly Dominguez  Procedure(s) Performed: Procedure(s) (LRB): COLONOSCOPY WITH PROPOFOL (N/A)  Patient location during evaluation: PACU Anesthesia Type: MAC Level of consciousness: awake and alert Pain management: pain level controlled Vital Signs Assessment: post-procedure vital signs reviewed and stable Respiratory status: spontaneous breathing, nonlabored ventilation, respiratory function stable and patient connected to nasal cannula oxygen Cardiovascular status: stable and blood pressure returned to baseline Anesthetic complications: no    Last Vitals:  Vitals:   03/25/16 1000 03/25/16 1010  BP: 122/83 129/68  Pulse:    Resp:    Temp:      Last Pain:  Vitals:   03/25/16 0956  TempSrc: Axillary                 Kyria Bumgardner S

## 2016-03-25 NOTE — Transfer of Care (Signed)
Immediate Anesthesia Transfer of Care Note  Patient: Kelly Dominguez  Procedure(s) Performed: Procedure(s): COLONOSCOPY WITH PROPOFOL (N/A)  Patient Location: Endoscopy Unit  Anesthesia Type:MAC  Level of Consciousness: awake, oriented and patient cooperative  Airway & Oxygen Therapy: Patient Spontanous Breathing and Patient connected to face mask oxygen  Post-op Assessment: Report given to RN and Post -op Vital signs reviewed and stable  Post vital signs: Reviewed  Last Vitals:  Vitals:   03/25/16 0741 03/25/16 0956  BP: 124/80 (!) 142/59  Pulse: 60 77  Resp: 10 18  Temp: 36.8 C     Last Pain:  Vitals:   03/25/16 0956  TempSrc: Oral         Complications: No apparent anesthesia complications

## 2016-03-25 NOTE — Anesthesia Procedure Notes (Signed)
Procedure Name: MAC Date/Time: 03/25/2016 9:05 AM Performed by: Jenne Campus Pre-anesthesia Checklist: Patient identified, Emergency Drugs available, Suction available, Patient being monitored and Timeout performed Patient Re-evaluated:Patient Re-evaluated prior to inductionOxygen Delivery Method: Simple face mask

## 2016-03-25 NOTE — Op Note (Addendum)
[facility] Patient Name: Kelly Dominguez Procedure Date : 03/25/2016 MRN: [id number] Attending MD: Clarene Essex , MD Date of Birth: 1966-05-18 CSN: [id number] Age: 50 Admit Type: Outpatient Procedure:                Colonoscopy Indications:              Screening for colorectal malignant neoplasm, This                            is the patient's first colonoscopy, Family history                            of colon cancer in a first-degree relative Providers:                Clarene Essex, MD, Carolynn Comment, RN, Alfonso Patten,                            Technician, Luciana Axe, CRNA Referring MD:              Medicines:                Propofol total dose 220 mg IV40 mg IV lidocaine Complications:            No immediate complications. Estimated Blood Loss:     Estimated blood loss: none. Procedure:                Pre-Anesthesia Assessment:                           - Prior to the procedure, a History and Physical                            was performed, and patient medications and                            allergies were reviewed. The patient's tolerance of                            previous anesthesia was also reviewed. The risks                            and benefits of the procedure and the sedation                            options and risks were discussed with the patient.                            All questions were answered, and informed consent                            was obtained. Prior Anticoagulants: The patient has                            taken no previous anticoagulant or antiplatelet  agents. ASA Grade Assessment: III - A patient with                            severe systemic disease. After reviewing the risks                            and benefits, the patient was deemed in                            satisfactory condition to undergo the procedure.                           After obtaining informed consent, the colonoscope                             was passed under direct vision. Throughout the                            procedure, the patient's blood pressure, pulse, and                            oxygen saturations were monitored continuously. The                            [id number]) scope was introduced through                            the anus and advanced to the the cecum, identified                            by appendiceal orifice and ileocecal valve. The                            ileocecal valve, appendiceal orifice, and rectum                            were photographed. The colonoscopy was somewhat                            difficult due to significant looping and a tortuous                            colon. Successful completion of the procedure was                            aided by applying abdominal pressure. The patient                            tolerated the procedure well. The quality of the                            bowel preparation was adequate she had some  difficulty holding air when the scope was in the                            left sigmoid. Scope In: 9:19:49 AM Scope Out: 9:46:43 AM Scope Withdrawal Time: 0 hours 15 minutes 47 seconds  Total Procedure Duration: 0 hours 26 minutes 54 seconds  Findings:      Internal hemorrhoids were found during retroflexion, during perianal       exam and during digital exam. The hemorrhoids were small.      A small polyp was found in the mid transverse colon. The polyp was       semi-sessile. Biopsies were taken with a cold forceps for histology.      The exam was otherwise without abnormality. Impression:               - Internal hemorrhoids.                           - One small polyp in the mid transverse colon.                            Biopsied.                           - The examination was otherwise normal. Moderate Sedation:      moderate sedation-none Recommendation:           - Patient  has a contact number available for                            emergencies. The signs and symptoms of potential                            delayed complications were discussed with the                            patient. Return to normal activities tomorrow.                            Written discharge instructions were provided to the                            patient.                           - Soft diet today.                           - Continue present medications.                           - Await pathology results.                           - Repeat colonoscopy in 5 years for surveillance                            based on pathology results.                           -  Return to GI office PRN.                           - Telephone GI clinic for pathology results in 1                            week.                           - Telephone GI clinic if symptomatic PRN. Procedure Code(s):        --- Professional ---                           [phone], Colonoscopy, flexible; with biopsy, single                            or multiple Diagnosis Code(s):        --- Professional ---                           Z12.11, Encounter for screening for malignant                            neoplasm of colon                           D12.3, Benign neoplasm of transverse colon (hepatic                            flexure or splenic flexure)                           Z80.0, Family history of malignant neoplasm of                            digestive organs CPT copyright 2016 [employer]. All rights reserved. The codes documented in this report are preliminary and upon coder review may  be revised to meet current compliance requirements. Clarene Essex, MD 03/25/2016 9:52:49 AM This report has been signed electronically. Number of Addenda: 0

## 2016-03-25 NOTE — Discharge Instructions (Signed)
YOU HAD AN ENDOSCOPIC PROCEDURE TODAY: Refer to the procedure report and other information in the discharge instructions given to you for any specific questions about what was found during the examination. If this information does not answer your questions, please call [facility] office at [phone] to clarify.   YOU SHOULD EXPECT: Some feelings of bloating in the abdomen. Passage of more gas than usual. Walking can help get rid of the air that was put into your GI tract during the procedure and reduce the bloating. If you had a lower endoscopy (such as a colonoscopy or flexible sigmoidoscopy) you may notice spotting of blood in your stool or on the toilet paper. Some abdominal soreness may be present for a day or two, also.  DIET: Your first meal following the procedure should be a light meal and then it is ok to progress to your normal diet. A half-sandwich or bowl of soup is an example of a good first meal. Heavy or fried foods are harder to digest and may make you feel nauseous or bloated. Drink plenty of fluids but you should avoid alcoholic beverages for 24 hours. If you had a esophageal dilation, please see attached instructions for diet.   ACTIVITY: Your care partner should take you home directly after the procedure. You should plan to take it easy, moving slowly for the rest of the day. You can resume normal activity the day after the procedure however YOU SHOULD NOT DRIVE, use power tools, machinery or perform tasks that involve climbing or major physical exertion for 24 hours (because of the sedation medicines used during the test).   SYMPTOMS TO REPORT IMMEDIATELY: A gastroenterologist can be reached at any hour. Please call [phone]  for any of the following symptoms:  Following lower endoscopy (colonoscopy, flexible sigmoidoscopy) Excessive amounts of blood in the stool  Significant tenderness, worsening of abdominal pains  Swelling of the abdomen that is new, acute  Fever of 100 or  higher  Following upper endoscopy (EGD, EUS, ERCP, esophageal dilation) Vomiting of blood or coffee ground material  New, significant abdominal pain  New, significant chest pain or pain under the shoulder blades  Painful or persistently difficult swallowing  New shortness of breath  Black, tarry-looking or red, bloody stools  FOLLOW UP:  If any biopsies were taken you will be contacted by phone or by letter within the next 1-3 weeks. Call [phone]  if you have not heard about the biopsies in 3 weeks.  Please also call with any specific questions about appointments or follow up tests.  Call if question or problem otherwise call for biopsy report in 1 week and follow-up as needed particularly if constipation gets worse

## 2016-03-25 NOTE — Progress Notes (Signed)
Kelly Dominguez 9:03 AM  Subjective: Patient with no new complaints since we saw her in the office and her case discussed with her mother as well  Objective: Vital signs stable afebrile exam please see preassessment evaluation  Assessment: Family history of colon cancer requesting colonic screening  Plan: Okay to proceed with colonoscopy with anesthesia assistance  [facility]  Pager [phone] After 5PM or if no answer call [phone]

## 2016-04-16 ENCOUNTER — Ambulatory Visit (INDEPENDENT_AMBULATORY_CARE_PROVIDER_SITE_OTHER): Payer: Medicare Other | Admitting: Psychology

## 2016-04-16 DIAGNOSIS — F250 Schizoaffective disorder, bipolar type: Secondary | ICD-10-CM | POA: Diagnosis not present

## 2016-04-28 ENCOUNTER — Other Ambulatory Visit: Payer: Self-pay | Admitting: Dermatology

## 2016-05-19 ENCOUNTER — Ambulatory Visit (INDEPENDENT_AMBULATORY_CARE_PROVIDER_SITE_OTHER): Payer: Medicare Other | Admitting: Psychology

## 2016-05-19 DIAGNOSIS — F250 Schizoaffective disorder, bipolar type: Secondary | ICD-10-CM | POA: Diagnosis not present

## 2016-06-30 ENCOUNTER — Ambulatory Visit (INDEPENDENT_AMBULATORY_CARE_PROVIDER_SITE_OTHER): Payer: Medicare Other | Admitting: Psychology

## 2016-06-30 DIAGNOSIS — F250 Schizoaffective disorder, bipolar type: Secondary | ICD-10-CM

## 2016-07-23 ENCOUNTER — Ambulatory Visit (INDEPENDENT_AMBULATORY_CARE_PROVIDER_SITE_OTHER): Payer: Medicare Other | Admitting: Psychology

## 2016-07-23 DIAGNOSIS — F250 Schizoaffective disorder, bipolar type: Secondary | ICD-10-CM | POA: Diagnosis not present

## 2016-09-03 ENCOUNTER — Ambulatory Visit (INDEPENDENT_AMBULATORY_CARE_PROVIDER_SITE_OTHER): Payer: Medicare Other | Admitting: Psychology

## 2016-09-03 DIAGNOSIS — F250 Schizoaffective disorder, bipolar type: Secondary | ICD-10-CM

## 2016-10-01 ENCOUNTER — Ambulatory Visit (INDEPENDENT_AMBULATORY_CARE_PROVIDER_SITE_OTHER): Payer: Medicare Other | Admitting: Psychology

## 2016-10-01 DIAGNOSIS — F250 Schizoaffective disorder, bipolar type: Secondary | ICD-10-CM | POA: Diagnosis not present

## 2016-10-22 ENCOUNTER — Ambulatory Visit (INDEPENDENT_AMBULATORY_CARE_PROVIDER_SITE_OTHER): Payer: Medicare Other | Admitting: Psychology

## 2016-10-22 DIAGNOSIS — F250 Schizoaffective disorder, bipolar type: Secondary | ICD-10-CM | POA: Diagnosis not present

## 2016-11-19 ENCOUNTER — Ambulatory Visit (INDEPENDENT_AMBULATORY_CARE_PROVIDER_SITE_OTHER): Payer: Medicare Other | Admitting: Psychology

## 2016-11-19 DIAGNOSIS — F250 Schizoaffective disorder, bipolar type: Secondary | ICD-10-CM | POA: Diagnosis not present

## 2016-12-10 ENCOUNTER — Ambulatory Visit (INDEPENDENT_AMBULATORY_CARE_PROVIDER_SITE_OTHER): Payer: Medicare Other | Admitting: Psychology

## 2016-12-10 DIAGNOSIS — F250 Schizoaffective disorder, bipolar type: Secondary | ICD-10-CM | POA: Diagnosis not present

## 2017-01-07 ENCOUNTER — Ambulatory Visit: Payer: Medicare Other | Admitting: Psychology

## 2017-02-04 ENCOUNTER — Ambulatory Visit: Payer: Medicare Other | Admitting: Psychology

## 2017-03-11 ENCOUNTER — Ambulatory Visit: Payer: Medicare Other | Admitting: Psychology

## 2017-08-10 ENCOUNTER — Ambulatory Visit (INDEPENDENT_AMBULATORY_CARE_PROVIDER_SITE_OTHER): Payer: Medicare Other | Admitting: Psychology

## 2017-08-10 DIAGNOSIS — F250 Schizoaffective disorder, bipolar type: Secondary | ICD-10-CM

## 2017-09-07 ENCOUNTER — Ambulatory Visit (INDEPENDENT_AMBULATORY_CARE_PROVIDER_SITE_OTHER): Payer: Medicare Other | Admitting: Psychology

## 2017-09-07 DIAGNOSIS — F250 Schizoaffective disorder, bipolar type: Secondary | ICD-10-CM

## 2017-10-05 ENCOUNTER — Ambulatory Visit (INDEPENDENT_AMBULATORY_CARE_PROVIDER_SITE_OTHER): Payer: Medicare Other | Admitting: Psychology

## 2017-10-05 DIAGNOSIS — F250 Schizoaffective disorder, bipolar type: Secondary | ICD-10-CM | POA: Diagnosis not present

## 2017-10-27 ENCOUNTER — Ambulatory Visit (INDEPENDENT_AMBULATORY_CARE_PROVIDER_SITE_OTHER): Payer: Medicare Other | Admitting: Psychology

## 2017-10-27 DIAGNOSIS — F250 Schizoaffective disorder, bipolar type: Secondary | ICD-10-CM

## 2017-11-24 ENCOUNTER — Ambulatory Visit (INDEPENDENT_AMBULATORY_CARE_PROVIDER_SITE_OTHER): Payer: Medicare Other | Admitting: Psychology

## 2017-11-24 DIAGNOSIS — F250 Schizoaffective disorder, bipolar type: Secondary | ICD-10-CM

## 2018-01-05 ENCOUNTER — Ambulatory Visit (INDEPENDENT_AMBULATORY_CARE_PROVIDER_SITE_OTHER): Payer: Medicare Other | Admitting: Psychology

## 2018-01-05 DIAGNOSIS — F250 Schizoaffective disorder, bipolar type: Secondary | ICD-10-CM | POA: Diagnosis not present

## 2018-02-09 ENCOUNTER — Ambulatory Visit (INDEPENDENT_AMBULATORY_CARE_PROVIDER_SITE_OTHER): Payer: Medicare Other | Admitting: Psychology

## 2018-02-09 DIAGNOSIS — F250 Schizoaffective disorder, bipolar type: Secondary | ICD-10-CM

## 2018-03-16 ENCOUNTER — Ambulatory Visit: Payer: Medicare Other | Admitting: Psychology

## 2018-04-04 ENCOUNTER — Ambulatory Visit: Payer: Medicare Other | Admitting: Psychology

## 2018-05-11 ENCOUNTER — Ambulatory Visit (INDEPENDENT_AMBULATORY_CARE_PROVIDER_SITE_OTHER): Payer: Medicare Other | Admitting: Psychology

## 2018-05-11 DIAGNOSIS — F250 Schizoaffective disorder, bipolar type: Secondary | ICD-10-CM

## 2018-05-18 ENCOUNTER — Ambulatory Visit: Payer: Medicare Other | Admitting: Psychology

## 2018-06-15 ENCOUNTER — Ambulatory Visit (INDEPENDENT_AMBULATORY_CARE_PROVIDER_SITE_OTHER): Payer: Medicare Other | Admitting: Psychology

## 2018-06-15 DIAGNOSIS — F250 Schizoaffective disorder, bipolar type: Secondary | ICD-10-CM

## 2018-07-18 ENCOUNTER — Ambulatory Visit (INDEPENDENT_AMBULATORY_CARE_PROVIDER_SITE_OTHER): Payer: Medicare Other | Admitting: Psychology

## 2018-07-18 DIAGNOSIS — F250 Schizoaffective disorder, bipolar type: Secondary | ICD-10-CM

## 2018-08-17 ENCOUNTER — Ambulatory Visit (INDEPENDENT_AMBULATORY_CARE_PROVIDER_SITE_OTHER): Payer: Medicare Other | Admitting: Psychology

## 2018-08-17 DIAGNOSIS — F250 Schizoaffective disorder, bipolar type: Secondary | ICD-10-CM

## 2018-09-14 ENCOUNTER — Ambulatory Visit: Payer: Medicare Other | Admitting: Psychology

## 2018-09-18 ENCOUNTER — Ambulatory Visit (INDEPENDENT_AMBULATORY_CARE_PROVIDER_SITE_OTHER): Payer: Medicare Other | Admitting: Psychology

## 2018-09-18 DIAGNOSIS — F250 Schizoaffective disorder, bipolar type: Secondary | ICD-10-CM | POA: Diagnosis not present

## 2018-09-22 ENCOUNTER — Ambulatory Visit: Payer: Medicare Other | Admitting: Psychology

## 2018-10-12 ENCOUNTER — Ambulatory Visit: Payer: Medicare Other | Admitting: Psychology

## 2018-11-09 ENCOUNTER — Ambulatory Visit: Payer: Medicare Other | Admitting: Psychology

## 2018-11-21 ENCOUNTER — Ambulatory Visit: Payer: Medicare Other | Admitting: Psychology

## 2018-12-01 ENCOUNTER — Ambulatory Visit: Payer: Medicare Other | Admitting: Psychology

## 2019-01-04 ENCOUNTER — Ambulatory Visit (INDEPENDENT_AMBULATORY_CARE_PROVIDER_SITE_OTHER): Payer: Medicare Other | Admitting: Psychology

## 2019-01-04 DIAGNOSIS — F250 Schizoaffective disorder, bipolar type: Secondary | ICD-10-CM

## 2019-02-05 ENCOUNTER — Ambulatory Visit: Payer: Medicare Other | Admitting: Psychology

## 2019-02-16 ENCOUNTER — Ambulatory Visit (INDEPENDENT_AMBULATORY_CARE_PROVIDER_SITE_OTHER): Payer: Medicare Other | Admitting: Psychology

## 2019-02-16 DIAGNOSIS — F250 Schizoaffective disorder, bipolar type: Secondary | ICD-10-CM

## 2019-03-05 ENCOUNTER — Ambulatory Visit (INDEPENDENT_AMBULATORY_CARE_PROVIDER_SITE_OTHER): Payer: Medicare Other | Admitting: Psychology

## 2019-03-05 DIAGNOSIS — F4323 Adjustment disorder with mixed anxiety and depressed mood: Secondary | ICD-10-CM | POA: Diagnosis not present

## 2019-03-05 DIAGNOSIS — F250 Schizoaffective disorder, bipolar type: Secondary | ICD-10-CM

## 2019-03-30 ENCOUNTER — Ambulatory Visit (INDEPENDENT_AMBULATORY_CARE_PROVIDER_SITE_OTHER): Payer: Medicaid Other | Admitting: Psychology

## 2019-03-30 DIAGNOSIS — F250 Schizoaffective disorder, bipolar type: Secondary | ICD-10-CM | POA: Diagnosis not present

## 2019-04-12 ENCOUNTER — Ambulatory Visit: Payer: Medicare Other | Admitting: Psychology

## 2019-04-17 ENCOUNTER — Ambulatory Visit (INDEPENDENT_AMBULATORY_CARE_PROVIDER_SITE_OTHER): Payer: Medicaid Other | Admitting: Psychology

## 2019-04-17 DIAGNOSIS — F250 Schizoaffective disorder, bipolar type: Secondary | ICD-10-CM | POA: Diagnosis not present

## 2019-05-08 ENCOUNTER — Ambulatory Visit: Payer: Medicare Other | Admitting: Psychology

## 2019-05-14 ENCOUNTER — Ambulatory Visit: Payer: Medicare Other | Admitting: Psychology

## 2019-05-28 ENCOUNTER — Ambulatory Visit: Payer: Medicare Other | Admitting: Psychology

## 2019-06-19 ENCOUNTER — Ambulatory Visit (INDEPENDENT_AMBULATORY_CARE_PROVIDER_SITE_OTHER): Payer: Medicare Other | Admitting: Psychology

## 2019-06-19 DIAGNOSIS — F250 Schizoaffective disorder, bipolar type: Secondary | ICD-10-CM | POA: Diagnosis not present

## 2019-07-09 ENCOUNTER — Ambulatory Visit (INDEPENDENT_AMBULATORY_CARE_PROVIDER_SITE_OTHER): Payer: Medicare Other | Admitting: Psychology

## 2019-07-09 DIAGNOSIS — F250 Schizoaffective disorder, bipolar type: Secondary | ICD-10-CM | POA: Diagnosis not present

## 2019-08-01 ENCOUNTER — Ambulatory Visit (INDEPENDENT_AMBULATORY_CARE_PROVIDER_SITE_OTHER): Payer: Medicare Other | Admitting: Psychology

## 2019-08-01 DIAGNOSIS — F250 Schizoaffective disorder, bipolar type: Secondary | ICD-10-CM

## 2019-08-08 ENCOUNTER — Other Ambulatory Visit: Payer: Self-pay

## 2019-08-08 ENCOUNTER — Emergency Department (HOSPITAL_COMMUNITY): Payer: Medicare Other

## 2019-08-08 ENCOUNTER — Emergency Department (HOSPITAL_COMMUNITY)
Admission: EM | Admit: 2019-08-08 | Discharge: 2019-08-08 | Disposition: A | Discharge status: Home / Self Care | Payer: Medicare Other | Attending: Emergency Medicine | Admitting: Emergency Medicine

## 2019-08-08 ENCOUNTER — Encounter (HOSPITAL_COMMUNITY): Payer: Self-pay | Admitting: Emergency Medicine

## 2019-08-08 DIAGNOSIS — Y92121 Bathroom in nursing home as the place of occurrence of the external cause: Secondary | ICD-10-CM | POA: Diagnosis not present

## 2019-08-08 DIAGNOSIS — M5030 Other cervical disc degeneration, unspecified cervical region: Secondary | ICD-10-CM | POA: Diagnosis not present

## 2019-08-08 DIAGNOSIS — Y93E1 Activity, personal bathing and showering: Secondary | ICD-10-CM | POA: Insufficient documentation

## 2019-08-08 DIAGNOSIS — Y999 Unspecified external cause status: Secondary | ICD-10-CM | POA: Diagnosis not present

## 2019-08-08 DIAGNOSIS — Z79899 Other long term (current) drug therapy: Secondary | ICD-10-CM | POA: Diagnosis not present

## 2019-08-08 DIAGNOSIS — W07XXXA Fall from chair, initial encounter: Secondary | ICD-10-CM | POA: Diagnosis not present

## 2019-08-08 DIAGNOSIS — W19XXXA Unspecified fall, initial encounter: Secondary | ICD-10-CM

## 2019-08-08 DIAGNOSIS — S161XXA Strain of muscle, fascia and tendon at neck level, initial encounter: Secondary | ICD-10-CM | POA: Diagnosis not present

## 2019-08-08 DIAGNOSIS — I10 Essential (primary) hypertension: Secondary | ICD-10-CM | POA: Insufficient documentation

## 2019-08-08 DIAGNOSIS — S199XXA Unspecified injury of neck, initial encounter: Secondary | ICD-10-CM | POA: Diagnosis present

## 2019-08-08 NOTE — Discharge Instructions (Addendum)
You may take Tylenol as needed as directed for pain.

## 2019-08-08 NOTE — ED Triage Notes (Signed)
Patient arrived by EMS from group home ([address], [place], [place]). Patient fell while in shower chair and went backward per EMS.   Patient has neck pain per EMS.   Patient is not ambulatory at baseline.   VS WDL per EMS.   C-Collar placed by EMS.

## 2019-08-08 NOTE — ED Provider Notes (Signed)
[facility] DEPT Provider Note   CSN: [id number] Arrival date & time: 08/08/19  1619     History Chief Complaint  Patient presents with  . Fall    Kelly Dominguez is a 54 y.o. female.  54 year old female brought in by EMS from group home for neck pain after a fall.  Patient was strapped to a shower chair, staff was lifting the shower chair up over the lip of the shower and into the shower when she fell backwards and hit the back of her neck.  Patient did not loss consciousness, denies headache, reports pain in her neck.  She is not anticoagulated, denies any other injuries, complaints or concerns.        Past Medical History:  Diagnosis Date  . Anxiety   . Cataract    left eye  . Cerebral palsy ([place])   . Depression   . Flexion contractures   . Hearing loss    mild to moderate  . Hypertension   . Neurogenic bladder   . Reflux   . Retinal detachment    right eye  . Schizo affective schizophrenia ([place])   . Seizures ([place])     There are no problems to display for this patient.   Past Surgical History:  Procedure Laterality Date  . ABDOMINAL HYSTERECTOMY    . COLONOSCOPY WITH PROPOFOL N/A 03/25/2016   Procedure: COLONOSCOPY WITH PROPOFOL;  Surgeon: Clarene Essex, MD;  Location: [facility] ENDOSCOPY;  Service: Endoscopy;  Laterality: N/A;  . HAND SURGERY    . LEG SURGERY       OB History   No obstetric history on file.     History reviewed. No pertinent family history.  Social History   Tobacco Use  . Smoking status: Never Smoker  . Smokeless tobacco: Never Used  Substance Use Topics  . Alcohol use: No  . Drug use: No    Home Medications Prior to Admission medications   Medication Sig Start Date End Date Taking? Authorizing Provider  acetaminophen (TYLENOL) 160 MG/5ML liquid Take 640 mg by mouth every 4 (four) hours as needed for fever or pain.   Yes [provider]  acetaminophen (TYLENOL) 325 MG tablet Take 650 mg by mouth  every 4 (four) hours as needed for moderate pain.   Yes [provider]  alum & mag hydroxide-simeth (MAALOX/MYLANTA) 200-200-20 MG/5ML suspension Take 15 mLs by mouth every 6 (six) hours as needed for indigestion or heartburn.   Yes [provider]  AMITIZA 24 MCG capsule Take 24 mcg by mouth 2 (two) times daily. 07/31/19  Yes [provider]  baclofen (LIORESAL) 10 MG tablet Take 10 mg by mouth 2 (two) times daily.   Yes [provider]  benztropine (COGENTIN) 0.5 MG tablet Take 0.5 mg by mouth 2 (two) times daily.   Yes [provider]  bisacodyl (DULCOLAX) 10 MG suppository Place 10 mg rectally daily as needed for moderate constipation.    Yes [provider]  Brompheniramine-Phenylephrine (DIMAPHEN CHILDRENS) 1-2.5 MG/5ML syrup Take 20 mLs by mouth every 6 (six) hours as needed for cough or congestion.   Yes [provider]  Calcium Citrate 200 MG TABS Take 1 tablet by mouth daily.   Yes [provider]  carbamide peroxide (DEBROX) 6.5 % OTIC solution Place 5 drops into both ears daily as needed (ear wax).   Yes [provider]  cetaphil (CETAPHIL) lotion Apply 1 application topically daily as needed for dry skin.  Yes [provider]  Cholecalciferol (VITAMIN D) 50 MCG (2000 UT) tablet Take 2,000 Units by mouth daily.   Yes [provider]  Cranberry 200 MG CAPS Take 1 capsule by mouth daily.   Yes [provider]  diphenhydrAMINE (BENADRYL) 25 mg capsule Take 25 mg by mouth every 4 (four) hours as needed (congestion).   Yes [provider]  divalproex (DEPAKOTE) 250 MG DR tablet Take 750 mg by mouth at bedtime.  07/31/19  Yes [provider]  divalproex (DEPAKOTE) 500 MG DR tablet Take 500 mg by mouth every morning.   Yes [provider]  docusate sodium (COLACE) 100 MG capsule Take 200 mg by mouth daily.   Yes [provider]  esomeprazole (NEXIUM) 20  MG capsule Take 20 mg by mouth daily. 07/31/19  Yes [provider]  guaiFENesin (ROBITUSSIN) 100 MG/5ML liquid Take 300 mg by mouth 4 (four) times daily as needed for cough.   Yes [provider]  hydrocortisone 2.5 % ointment Apply 1 application topically at bedtime.  07/16/19  Yes [provider]  hydrocortisone cream 1 % Apply 1 application topically daily as needed for itching.   Yes [provider]  loperamide (IMODIUM A-D) 2 MG tablet Take 2 mg by mouth 4 (four) times daily as needed for diarrhea or loose stools.   Yes [provider]  LORazepam (ATIVAN) 0.5 MG tablet Take 0.5 mg by mouth 2 (two) times daily.  07/31/19  Yes [provider]  LORazepam (ATIVAN) 1 MG tablet Take 1 mg by mouth every 4 (four) hours as needed for anxiety (agitation).    Yes [provider]  LORazepam (ATIVAN) 1 MG tablet Take 1 mg by mouth every 4 (four) hours as needed for anxiety (agitation).   Yes [provider]  Magnesium 400 MG TABS Take 800 mg by mouth daily.   Yes [provider]  magnesium hydroxide (MILK OF MAGNESIA) 400 MG/5ML suspension Take 30 mLs by mouth daily as needed for mild constipation.   Yes [provider]  methenamine (HIPREX) 1 G tablet Take 500 mg by mouth 2 (two) times daily with a meal.   Yes [provider]  neomycin-bacitracin-polymyxin (NEOSPORIN) [phone] ointment Apply 1 application topically 2 (two) times daily as needed (skin tears).   Yes [provider]  omega-3 acid ethyl esters (LOVAZA) 1 g capsule Take 1 g by mouth 2 (two) times daily.  07/31/19  Yes [provider]  Polyethyl Glycol-Propyl Glycol (SYSTANE) 0.4-0.3 % SOLN Apply 1 drop to eye 3 (three) times daily.   Yes [provider]  promethazine (PHENERGAN) 25 MG suppository Place 25 mg rectally every 4 (four) hours as needed for nausea or vomiting.   Yes [provider]  promethazine  (PHENERGAN) 25 MG tablet Take 25 mg by mouth every 4 (four) hours as needed for nausea or vomiting.   Yes [provider]  REXULTI 4 MG TABS Take 1 tablet by mouth daily. 07/31/19  Yes [provider]  Skin Protectants, Misc. (PERIGUARD) OINT Apply 1 application topically 4 (four) times daily as needed (dry skin).   Yes [provider]  venlafaxine XR (EFFEXOR-XR) 150 MG 24 hr capsule Take 150 mg by mouth 2 (two) times daily.  07/31/19  Yes [provider]  Vitamins A & D (VITAMIN A & D) ointment Apply 1 application topically 3 (three) times daily as needed for dry skin.   Yes [provider]  acetaminophen (TYLENOL) 500 MG tablet Take 2 tablets (1,000 mg total) by mouth every 6 (six) hours as needed for mild pain or moderate pain. Patient not taking: Reported on 08/08/2019 10/30/15   Forde Dandy, MD    Allergies    Ciprofloxacin, Cymbalta [duloxetine hcl], Septra [sulfamethoxazole-trimethoprim], Sulfa antibiotics, and Trilafon [perphenazine]  Review of Systems   Review of Systems  Constitutional: Negative for fever.  Eyes: Negative for visual disturbance.  Gastrointestinal: Negative for abdominal pain, nausea and vomiting.  Musculoskeletal: Positive for neck pain.  Skin: Negative for rash and wound.  Neurological: Negative for weakness, numbness and headaches.  Hematological: Does not bruise/bleed easily.  Psychiatric/Behavioral: Negative for confusion.  All other systems reviewed and are negative.   Physical Exam Updated Vital Signs BP (!) 173/95   Pulse 85   Temp 98 F (36.7 C) (Oral)   Resp (!) 22   Ht 4' (1.219 m)   Wt 72 kg   SpO2 98%   BMI 48.44 kg/m   Physical Exam Vitals and nursing note reviewed.  Constitutional:      General: She is not in acute distress.    Appearance: She is well-developed. She is not diaphoretic.  HENT:     Head: Normocephalic and atraumatic.  Eyes:     Extraocular Movements: Extraocular movements  intact.     Pupils: Pupils are equal, round, and reactive to light.  Neck:   Pulmonary:     Effort: Pulmonary effort is normal.  Musculoskeletal:        General: No swelling.     Cervical back: Tenderness present.     Comments: Able to move extremities equally.  Skin:    General: Skin is warm and dry.     Findings: No erythema or rash.  Neurological:     Mental Status: She is alert and oriented to person, place, and time.  Psychiatric:        Behavior: Behavior normal.     ED Results / Procedures / Treatments   Labs (all labs ordered are listed, but only abnormal results are displayed) Labs Reviewed - No data to display  EKG None  Radiology CT Cervical Spine Wo Contrast  Result Date: 08/08/2019 CLINICAL DATA:  Status post fall. EXAM: CT CERVICAL SPINE WITHOUT CONTRAST TECHNIQUE: Multidetector CT imaging of the cervical spine was performed without intravenous contrast. Multiplanar CT image reconstructions were also generated. COMPARISON:  October 30, 2015 FINDINGS: Alignment: There is approximately 2 mm anterolisthesis of the C4 on C5 vertebral body. Skull base and vertebrae: No acute fracture. No primary bone lesion or focal pathologic process. Soft tissues and spinal canal: No prevertebral fluid or swelling. No visible canal hematoma. Disc levels: C2-3: There is mild end plate spondylosis. Mild disc space narrowing is seen. Bilateral facet hypertrophy is noted. Normal central canal and intervertebral neuroforamina. C3-4: There is marked severity end plate spondylosis. Mild disc space narrowing is seen. Bilateral facet hypertrophy is noted. Normal central canal and intervertebral neuroforamina. C4-5: There is marked severity end plate spondylosis. Mild disc space narrowing is seen. Bilateral facet hypertrophy is noted. Normal central canal and intervertebral neuroforamina. C5-6: There is marked severity end plate spondylosis. Marked severity disc space narrowing is seen. Bilateral facet  hypertrophy is noted. Normal central canal and intervertebral neuroforamina. C6-7: There is marked severity end plate spondylosis. Moderate severity disc space narrowing is seen. Bilateral facet hypertrophy is noted. Normal central canal and intervertebral neuroforamina. C7-T1: There is marked severity end plate spondylosis. Moderate severity  disc space narrowing is seen. Bilateral facet hypertrophy is noted. Normal central canal and intervertebral neuroforamina. Upper chest: Negative. Other: None. IMPRESSION: 1. Marked severity multilevel degenerative changes without evidence of an acute fracture. Electronically Signed   By: Virgina Norfolk M.D.   On: 08/08/2019 18:43    Procedures Procedures (including critical care time)  Medications Ordered in ED Medications - No data to display  ED Course  I have reviewed the triage vital signs and the nursing notes.  Pertinent labs & imaging results that were available during my care of the patient were reviewed by me and considered in my medical decision making (see chart for details).  Clinical Course as of Aug 07 2038  Wed Aug 07, 6285  3959 54 year old female brought in by EMS from group home after fall with complaint of neck pain.  On exam has vague posterior neck pain, no step-offs or crepitus.  No other injuries or concerns, LOC, not anticoagulated.  CT C-spine shows degenerative changes, no acute bony injury.  Discussing with patient who is relieved, requests instructions for group home to give her Tylenol as needed as directed.   [LM]    Clinical Course User Index [LM] Roque Lias   MDM Rules/Calculators/A&P                       Final Clinical Impression(s) / ED Diagnoses Final diagnoses:  Fall, initial encounter  Strain of neck muscle, initial encounter  Degenerative disc disease, cervical    Rx / DC Orders ED Discharge Orders    None       Roque Lias 08/08/19 2040    Tegeler, Gwenyth Allegra,  MD 08/09/19 [phone]

## 2019-08-20 ENCOUNTER — Ambulatory Visit (INDEPENDENT_AMBULATORY_CARE_PROVIDER_SITE_OTHER): Payer: Medicare Other | Admitting: Psychology

## 2019-08-20 DIAGNOSIS — F250 Schizoaffective disorder, bipolar type: Secondary | ICD-10-CM

## 2019-09-11 ENCOUNTER — Ambulatory Visit: Payer: Medicare Other | Admitting: Psychology

## 2019-09-18 ENCOUNTER — Ambulatory Visit (INDEPENDENT_AMBULATORY_CARE_PROVIDER_SITE_OTHER): Payer: Medicare Other | Admitting: Psychology

## 2019-09-18 DIAGNOSIS — F250 Schizoaffective disorder, bipolar type: Secondary | ICD-10-CM | POA: Diagnosis not present

## 2019-10-16 ENCOUNTER — Ambulatory Visit (INDEPENDENT_AMBULATORY_CARE_PROVIDER_SITE_OTHER): Payer: Medicare Other | Admitting: Psychology

## 2019-10-16 DIAGNOSIS — F250 Schizoaffective disorder, bipolar type: Secondary | ICD-10-CM

## 2019-11-13 ENCOUNTER — Ambulatory Visit (INDEPENDENT_AMBULATORY_CARE_PROVIDER_SITE_OTHER): Payer: Medicare Other | Admitting: Psychology

## 2019-11-13 DIAGNOSIS — F250 Schizoaffective disorder, bipolar type: Secondary | ICD-10-CM | POA: Diagnosis not present

## 2019-12-11 ENCOUNTER — Ambulatory Visit (INDEPENDENT_AMBULATORY_CARE_PROVIDER_SITE_OTHER): Payer: Medicare Other | Admitting: Psychology

## 2019-12-11 DIAGNOSIS — F250 Schizoaffective disorder, bipolar type: Secondary | ICD-10-CM | POA: Diagnosis not present

## 2020-01-22 ENCOUNTER — Ambulatory Visit: Payer: Medicare Other | Admitting: Psychology

## 2020-02-12 ENCOUNTER — Ambulatory Visit (INDEPENDENT_AMBULATORY_CARE_PROVIDER_SITE_OTHER): Payer: Medicare Other | Admitting: Psychology

## 2020-02-12 DIAGNOSIS — F250 Schizoaffective disorder, bipolar type: Secondary | ICD-10-CM | POA: Diagnosis not present

## 2020-03-18 ENCOUNTER — Ambulatory Visit: Payer: Medicare Other | Admitting: Psychology

## 2020-04-22 ENCOUNTER — Ambulatory Visit: Payer: Medicare Other | Admitting: Psychology

## 2020-05-15 ENCOUNTER — Ambulatory Visit: Payer: Medicare Other | Admitting: Psychology

## 2020-06-12 ENCOUNTER — Ambulatory Visit: Payer: Medicare Other | Admitting: Psychology

## 2021-07-21 IMAGING — CT CT CERVICAL SPINE W/O CM
3 of 8 series · 9 of 33 positions shown, 10 images · non-contrast
Comparison: October 30, 2015

CLINICAL DATA: Status post fall.

EXAM:
CT CERVICAL SPINE WITHOUT CONTRAST
TECHNIQUE: Multidetector CT imaging of the cervical spine was performed without
intravenous contrast. Multiplanar CT image reconstructions were also
generated.

[Series 7: orthogonal bone · axial · 0.21mm/px · z∈[+1387,+1480]mm · 3 of 103 slices shown, 4 images]
[im 26/103  soft-tissue]
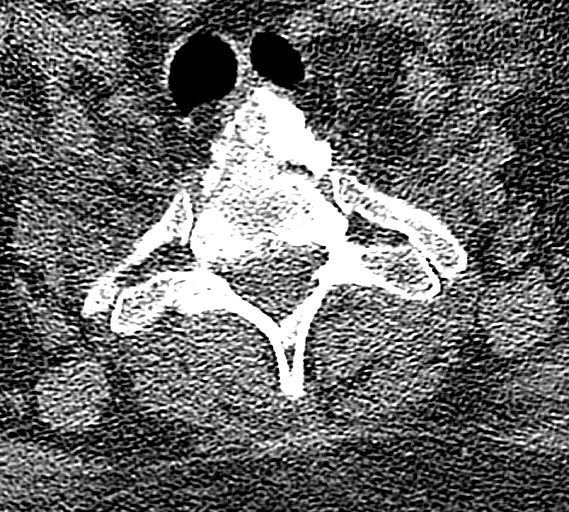
[im 26/103  bone]
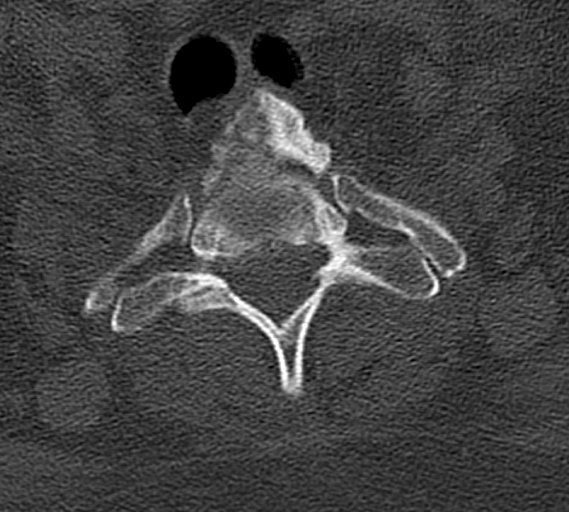
[im 52/103  bone]
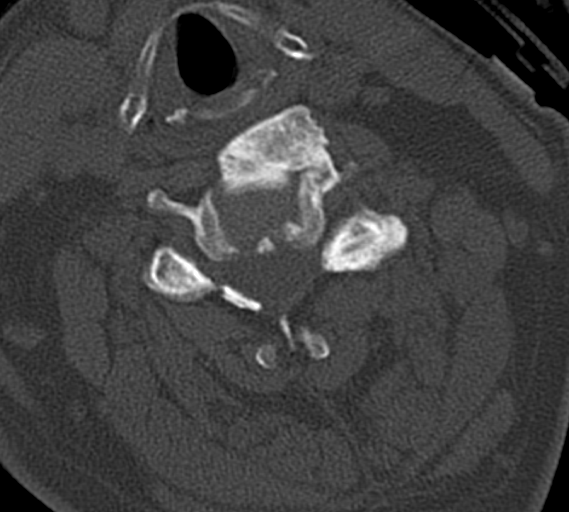
[im 77/103  bone]
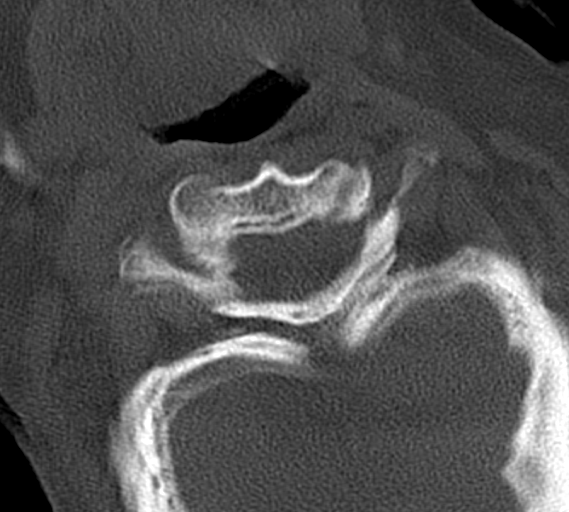

[Series 8: coronal bone · coronal · 0.23mm/px · 1 of 55 slices shown]
[im 28/55  bone]
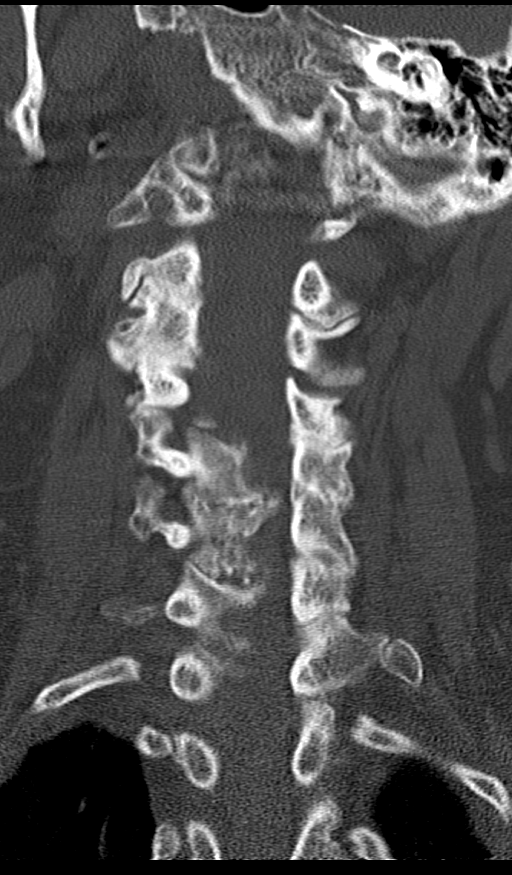

[Series 9: sagittal bone · sagittal · 0.21mm/px · 5 of 61 slices shown]
[im 11/61  bone]
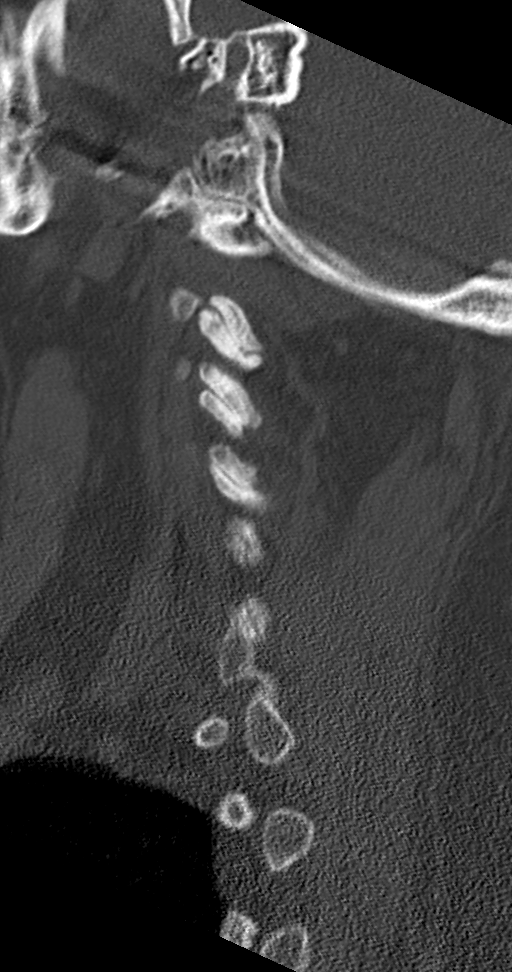
[im 21/61  bone]
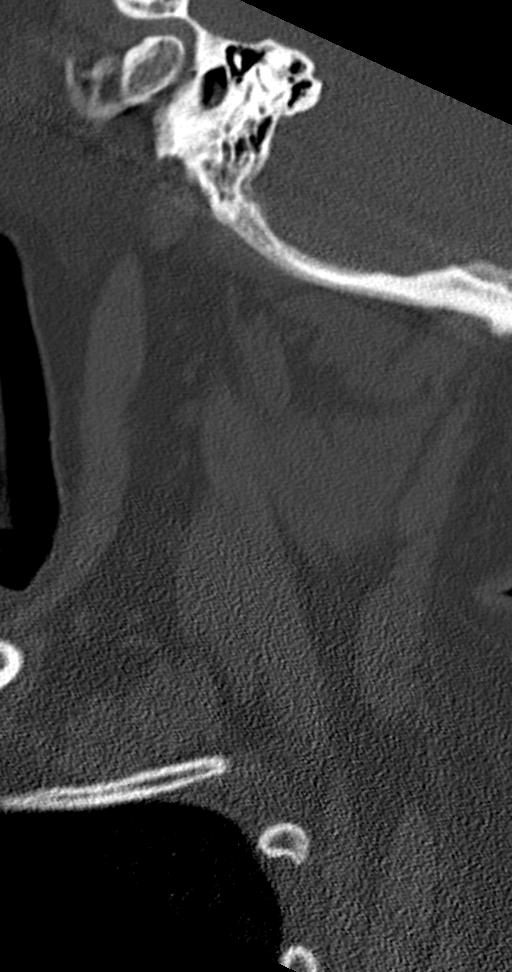
[im 31/61  bone]
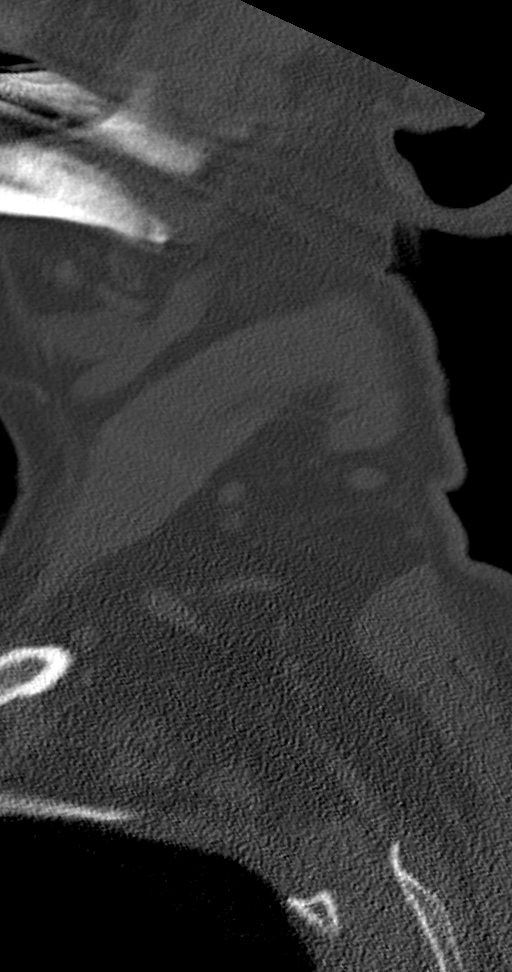
[im 41/61  bone]
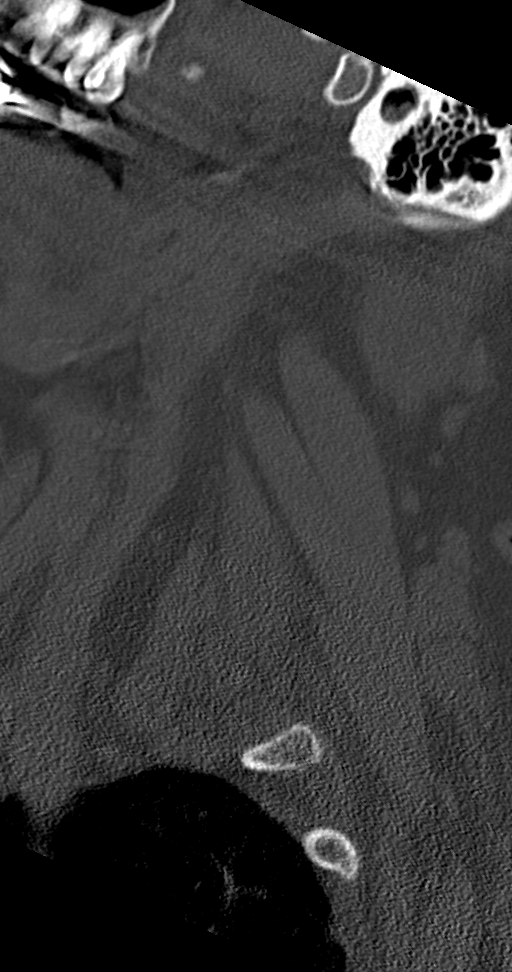
[im 51/61  bone]
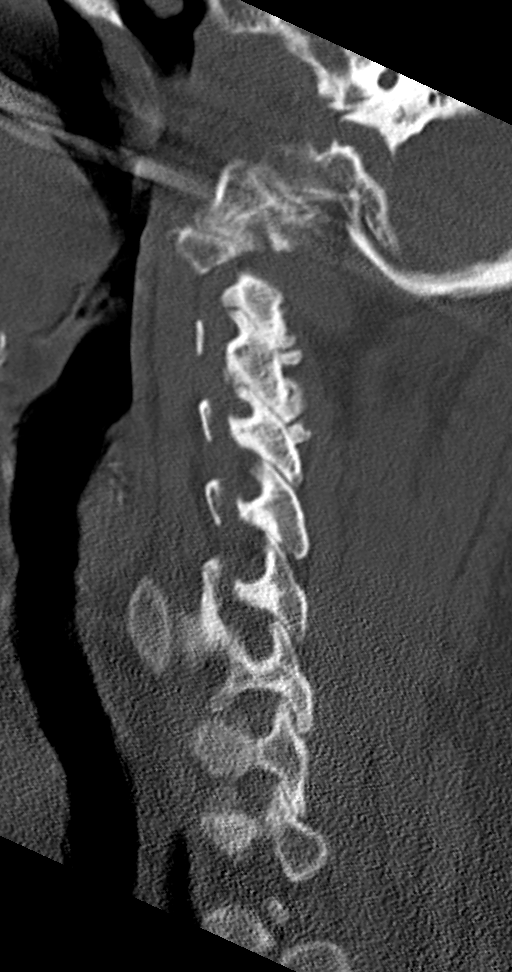

[9 of 33 positions shown; findings below may reference images not displayed]

FINDINGS: Alignment: There is approximately 2 mm anterolisthesis of the C4 on
C5 vertebral body.

Skull base and vertebrae: No acute fracture. No primary bone lesion
or focal pathologic process.

Soft tissues and spinal canal: No prevertebral fluid or swelling. No
visible canal hematoma.

Disc levels:

C2-3: There is mild end plate spondylosis. Mild disc space narrowing
is seen. Bilateral facet hypertrophy is noted. Normal central canal
and intervertebral neuroforamina.

C3-4: There is marked severity end plate spondylosis. Mild disc
space narrowing is seen. Bilateral facet hypertrophy is noted.
Normal central canal and intervertebral neuroforamina.

C4-5: There is marked severity end plate spondylosis. Mild disc
space narrowing is seen. Bilateral facet hypertrophy is noted.
Normal central canal and intervertebral neuroforamina.

C5-6: There is marked severity end plate spondylosis. Marked
severity disc space narrowing is seen. Bilateral facet hypertrophy
is noted. Normal central canal and intervertebral neuroforamina.

C6-7: There is marked severity end plate spondylosis. Moderate
severity disc space narrowing is seen. Bilateral facet hypertrophy
is noted. Normal central canal and intervertebral neuroforamina.

C7-T1: There is marked severity end plate spondylosis. Moderate
severity disc space narrowing is seen. Bilateral facet hypertrophy
is noted. Normal central canal and intervertebral neuroforamina.

Upper chest: Negative.

Other: None.
IMPRESSION: 1. Marked severity multilevel degenerative changes without evidence
of an acute fracture.
